# Patient Record
Sex: Male | Born: 1955 | Race: White | Hispanic: No | Marital: Married | Smoking: Never smoker
Health system: Southern US, Community
[De-identification: ages and names within clinical notes are randomized; demographics above are authoritative.]

## PROBLEM LIST (undated history)

## (undated) DIAGNOSIS — E785 Hyperlipidemia, unspecified: Secondary | ICD-10-CM

## (undated) DIAGNOSIS — M1990 Unspecified osteoarthritis, unspecified site: Secondary | ICD-10-CM

## (undated) DIAGNOSIS — I10 Essential (primary) hypertension: Secondary | ICD-10-CM

## (undated) DIAGNOSIS — D0460 Carcinoma in situ of skin of unspecified upper limb, including shoulder: Secondary | ICD-10-CM

## (undated) DIAGNOSIS — R112 Nausea with vomiting, unspecified: Secondary | ICD-10-CM

## (undated) DIAGNOSIS — C61 Malignant neoplasm of prostate: Secondary | ICD-10-CM

## (undated) DIAGNOSIS — K760 Fatty (change of) liver, not elsewhere classified: Secondary | ICD-10-CM

## (undated) DIAGNOSIS — Z9889 Other specified postprocedural states: Secondary | ICD-10-CM

## (undated) DIAGNOSIS — Z9989 Dependence on other enabling machines and devices: Secondary | ICD-10-CM

## (undated) DIAGNOSIS — J189 Pneumonia, unspecified organism: Secondary | ICD-10-CM

## (undated) DIAGNOSIS — C801 Malignant (primary) neoplasm, unspecified: Secondary | ICD-10-CM

## (undated) DIAGNOSIS — Z8719 Personal history of other diseases of the digestive system: Secondary | ICD-10-CM

## (undated) DIAGNOSIS — G4733 Obstructive sleep apnea (adult) (pediatric): Secondary | ICD-10-CM

## (undated) DIAGNOSIS — D649 Anemia, unspecified: Secondary | ICD-10-CM

## (undated) DIAGNOSIS — N529 Male erectile dysfunction, unspecified: Secondary | ICD-10-CM

## (undated) DIAGNOSIS — G4730 Sleep apnea, unspecified: Secondary | ICD-10-CM

## (undated) HISTORY — PX: OTHER SURGICAL HISTORY: SHX169

## (undated) HISTORY — DX: Unspecified osteoarthritis, unspecified site: M19.90

## (undated) HISTORY — DX: Hyperlipidemia, unspecified: E78.5

## (undated) HISTORY — DX: Carcinoma in situ of skin of unspecified upper limb, including shoulder: D04.60

## (undated) HISTORY — PX: COLONOSCOPY: SHX174

## (undated) HISTORY — DX: Male erectile dysfunction, unspecified: N52.9

## (undated) HISTORY — PX: KNEE ARTHROSCOPY: SUR90

## (undated) HISTORY — PX: PROSTATE SURGERY: SHX751

---

## 1998-11-04 ENCOUNTER — Ambulatory Visit (HOSPITAL_COMMUNITY): Admission: RE | Admit: 1998-11-04 | Discharge: 1998-11-04 | Payer: Self-pay | Admitting: Family Medicine

## 1998-11-04 ENCOUNTER — Encounter: Payer: Self-pay | Admitting: Family Medicine

## 1998-11-17 ENCOUNTER — Encounter: Payer: Self-pay | Admitting: Gastroenterology

## 1998-11-17 ENCOUNTER — Ambulatory Visit (HOSPITAL_COMMUNITY): Admission: RE | Admit: 1998-11-17 | Discharge: 1998-11-17 | Payer: Self-pay | Admitting: Gastroenterology

## 2004-01-11 ENCOUNTER — Ambulatory Visit (HOSPITAL_BASED_OUTPATIENT_CLINIC_OR_DEPARTMENT_OTHER): Admission: RE | Admit: 2004-01-11 | Discharge: 2004-01-11 | Payer: Self-pay | Admitting: Family Medicine

## 2004-08-12 ENCOUNTER — Encounter (INDEPENDENT_AMBULATORY_CARE_PROVIDER_SITE_OTHER): Payer: Self-pay | Admitting: Specialist

## 2004-08-12 ENCOUNTER — Ambulatory Visit (HOSPITAL_COMMUNITY): Admission: RE | Admit: 2004-08-12 | Discharge: 2004-08-12 | Payer: Self-pay | Admitting: Gastroenterology

## 2010-10-21 ENCOUNTER — Emergency Department (HOSPITAL_COMMUNITY)
Admission: EM | Admit: 2010-10-21 | Discharge: 2010-10-22 | Payer: Self-pay | Source: Home / Self Care | Admitting: Emergency Medicine

## 2010-11-06 ENCOUNTER — Ambulatory Visit (HOSPITAL_COMMUNITY)
Admission: RE | Admit: 2010-11-06 | Discharge: 2010-11-06 | Payer: Self-pay | Source: Home / Self Care | Attending: Family Medicine | Admitting: Family Medicine

## 2010-11-16 ENCOUNTER — Encounter
Admission: RE | Admit: 2010-11-16 | Discharge: 2010-11-16 | Payer: Self-pay | Source: Home / Self Care | Attending: Family Medicine | Admitting: Family Medicine

## 2011-01-11 LAB — URINALYSIS, ROUTINE W REFLEX MICROSCOPIC
Bilirubin Urine: NEGATIVE
Glucose, UA: NEGATIVE mg/dL
Hgb urine dipstick: NEGATIVE
Ketones, ur: NEGATIVE mg/dL
Nitrite: NEGATIVE
Protein, ur: NEGATIVE mg/dL
Specific Gravity, Urine: 1.019 (ref 1.005–1.030)
Urobilinogen, UA: 0.2 mg/dL (ref 0.0–1.0)
pH: 6.5 (ref 5.0–8.0)

## 2011-01-11 LAB — CBC
HCT: 41.7 % (ref 39.0–52.0)
Hemoglobin: 13.9 g/dL (ref 13.0–17.0)
MCH: 28.4 pg (ref 26.0–34.0)
MCHC: 33.3 g/dL (ref 30.0–36.0)
MCV: 85.3 fL (ref 78.0–100.0)
Platelets: 165 K/uL (ref 150–400)
RBC: 4.89 MIL/uL (ref 4.22–5.81)
RDW: 13.7 % (ref 11.5–15.5)
WBC: 7.5 10*3/uL (ref 4.0–10.5)

## 2011-01-11 LAB — DIFFERENTIAL
Basophils Absolute: 0 K/uL (ref 0.0–0.1)
Basophils Relative: 1 % (ref 0–1)
Eosinophils Absolute: 0.4 10*3/uL (ref 0.0–0.7)
Eosinophils Relative: 5 % (ref 0–5)
Lymphocytes Relative: 41 % (ref 12–46)
Lymphs Abs: 3.1 10*3/uL (ref 0.7–4.0)
Monocytes Absolute: 0.8 K/uL (ref 0.1–1.0)
Monocytes Relative: 10 % (ref 3–12)
Neutro Abs: 3.3 K/uL (ref 1.7–7.7)
Neutrophils Relative %: 44 % (ref 43–77)

## 2011-01-11 LAB — COMPREHENSIVE METABOLIC PANEL WITH GFR
Albumin: 3.9 g/dL (ref 3.5–5.2)
BUN: 13 mg/dL (ref 6–23)
Chloride: 103 meq/L (ref 96–112)
Creatinine, Ser: 1.15 mg/dL (ref 0.4–1.5)
Total Bilirubin: 0.5 mg/dL (ref 0.3–1.2)

## 2011-01-11 LAB — COMPREHENSIVE METABOLIC PANEL
ALT: 22 U/L (ref 0–53)
AST: 28 U/L (ref 0–37)
Alkaline Phosphatase: 58 U/L (ref 39–117)
CO2: 26 mEq/L (ref 19–32)
Calcium: 9 mg/dL (ref 8.4–10.5)
GFR calc Af Amer: >60 mL/min (ref >60)
GFR calc non Af Amer: >60 mL/min (ref >60)
Glucose, Bld: 97 mg/dL (ref 70–99)
Potassium: 3.7 mEq/L (ref 3.5–5.1)
Sodium: 137 mEq/L (ref 135–145)
Total Protein: 6.8 g/dL (ref 6.0–8.3)

## 2011-01-11 LAB — POCT CARDIAC MARKERS
CKMB, poc: <1 ng/mL — ABNORMAL LOW (ref 1.0–8.0)
CKMB, poc: <1 ng/mL — ABNORMAL LOW (ref 1.0–8.0)
Myoglobin, poc: 53.5 ng/mL (ref 12–200)
Myoglobin, poc: 56.9 ng/mL (ref 12–200)
Troponin i, poc: <0.05 ng/mL (ref 0.00–0.09)
Troponin i, poc: <0.05 ng/mL (ref 0.00–0.09)

## 2011-01-11 LAB — LIPASE, BLOOD: Lipase: 28 U/L (ref 11–59)

## 2011-01-25 ENCOUNTER — Other Ambulatory Visit: Payer: Self-pay | Admitting: Dermatology

## 2011-03-19 NOTE — Op Note (Signed)
NAME:  Johnny Logan, Johnny Logan             ACCOUNT NO.:  [id number]   MEDICAL RECORD NO.:  [id number]          PATIENT TYPE:  AMB   LOCATION:  ENDO                         FACILITY:  [facility]   PHYSICIAN:  John C. Madilyn Fireman, M.D.    DATE OF BIRTH:  Feb 08, 1956   DATE OF PROCEDURE:  08/12/2004  DATE OF DISCHARGE:                                 OPERATIVE REPORT   PROCEDURE:  Colonoscopy with polypectomy.   INDICATIONS FOR PROCEDURE:  Family history of colon cancer in a first-degree  relative.   PROCEDURE:  The patient was placed in the left lateral decubitus position  and placed on the pulse monitor, with continuous low-flow oxygen delivered  by nasal cannula.  He was sedated with 87.5 mcg IV fentanyl and 7 mg IV  Versed.  The Olympus video colonoscope was inserted into the rectum and  advanced to the cecum, confirmed by transillumination at McBurney's point  and visualization of the ileocecal valve and appendiceal orifice.  The prep  was excellent.  The cecum, ascending, transverse, descending, and sigmoid  colon all appeared normal, with no masses, polyps, diverticula, or other  mucosal abnormalities.  Within the rectum at 20 cm, there was a 6 mm polyp  that was fulgurated by hot biopsy.  The remainder of the rectum appeared  normal.  The scope was then withdrawn and the patient returned to the  recovery room in stable condition.  He tolerated the procedure well, and  there were no immediate complications.   IMPRESSION:  Rectal polyp.  Otherwise normal study.   PLAN:  Await histology to determine method and interval for future colon  screening.      JCH/MEDQ  D:  08/12/2004  T:  08/12/2004  Job:  [id number]   cc:   Stacie Acres. White, M.D.  [address]  [place]  [place] [address]  Fax: [phone]

## 2012-06-24 IMAGING — CR DG ABDOMEN ACUTE W/ 1V CHEST
4 series · 4 of 4 positions shown · non-contrast
Comparison: Abdominal ultrasound performed 10/21/2010

CLINICAL DATA: Chest pain and tightness; nausea.

ACUTE ABDOMEN SERIES (ABDOMEN 2 VIEW & CHEST 1 VIEW)

[w chest pa]
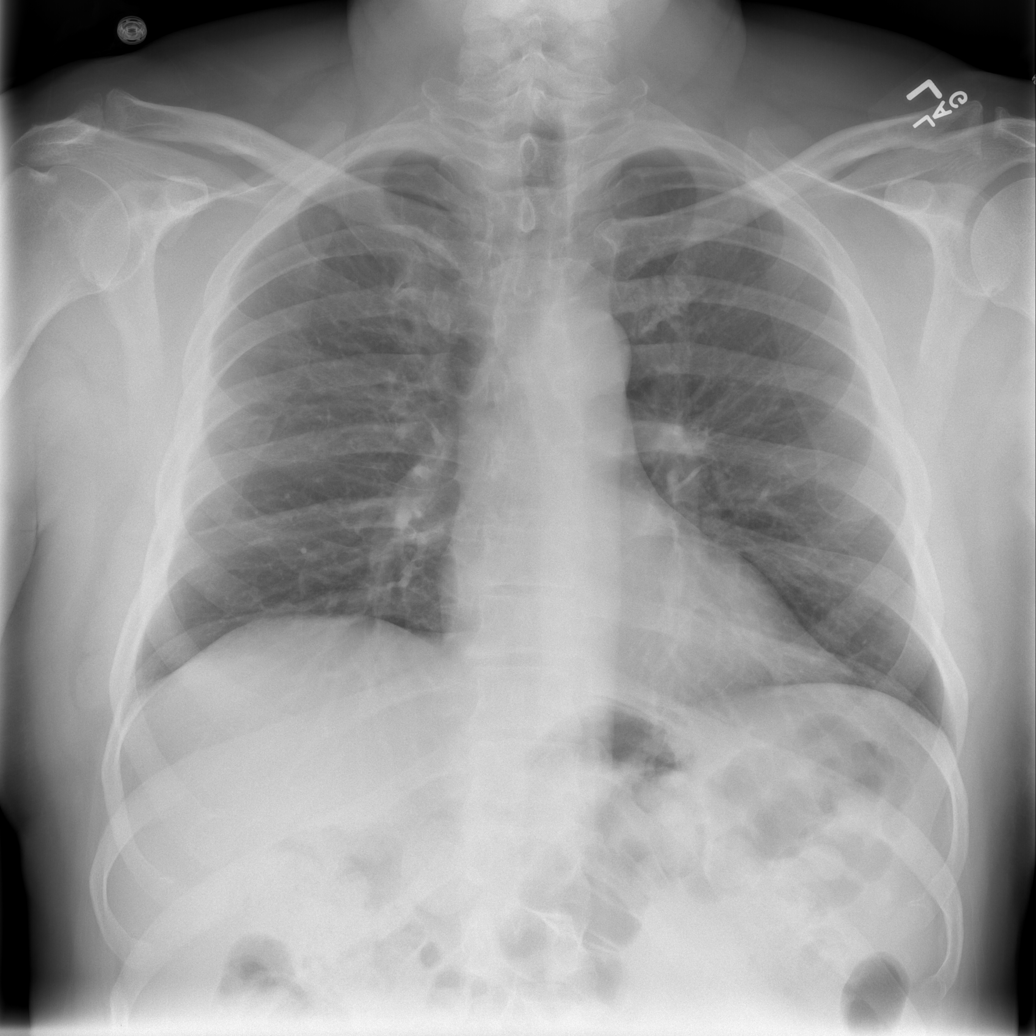

[w abdomen upright *]
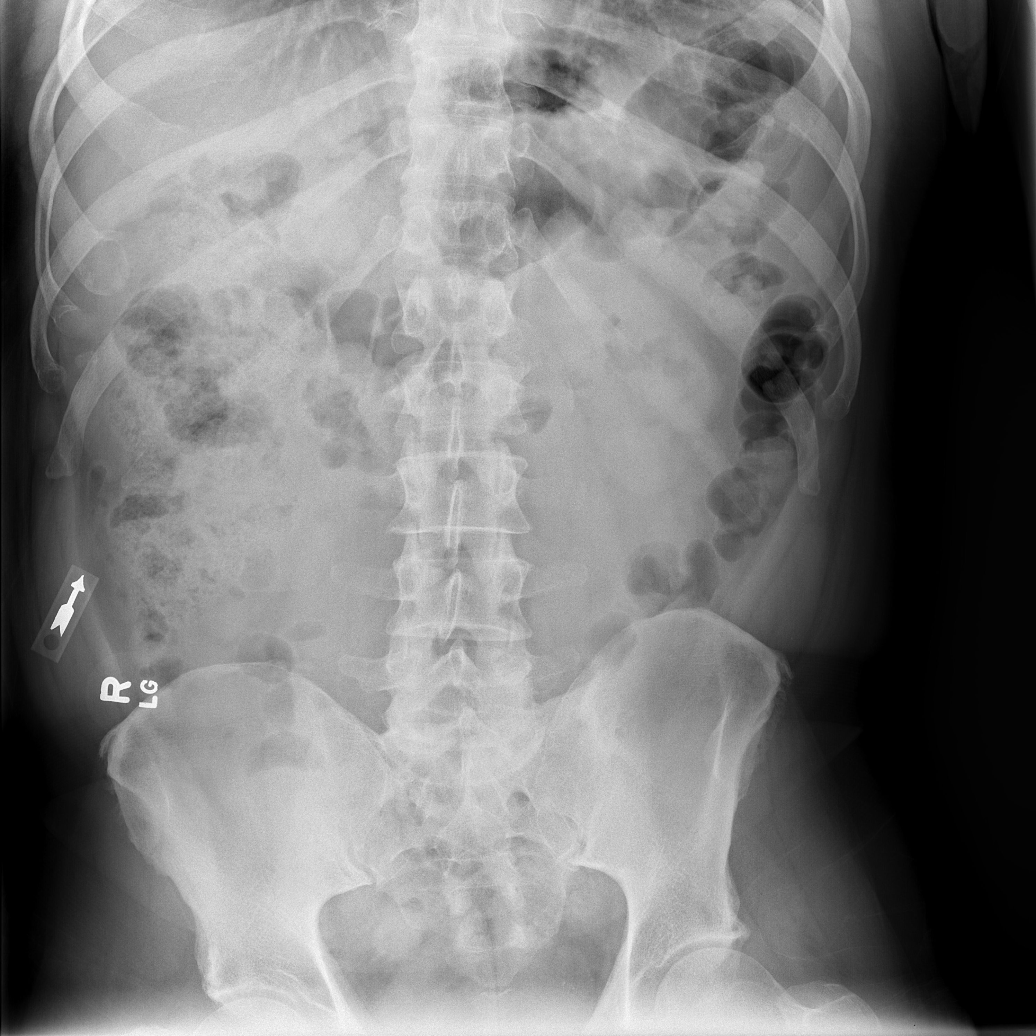

[t abdomen supine (1 of 2)]
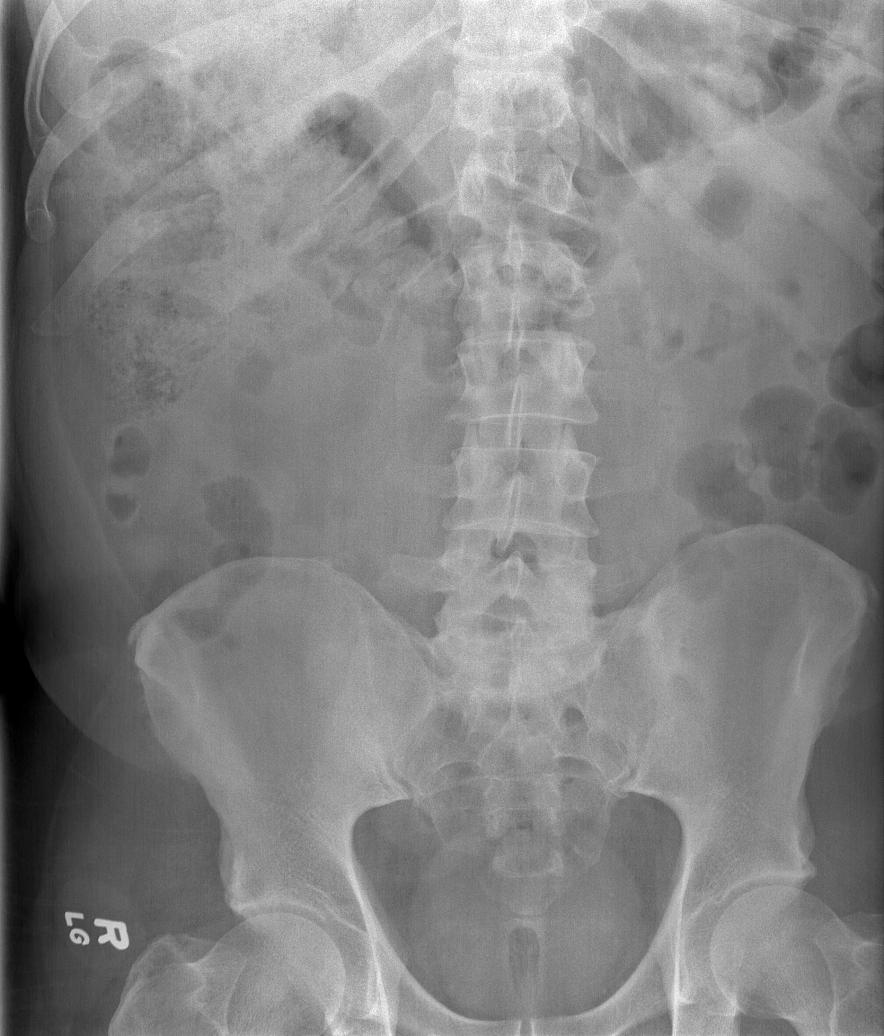

[t abdomen supine (2 of 2)]
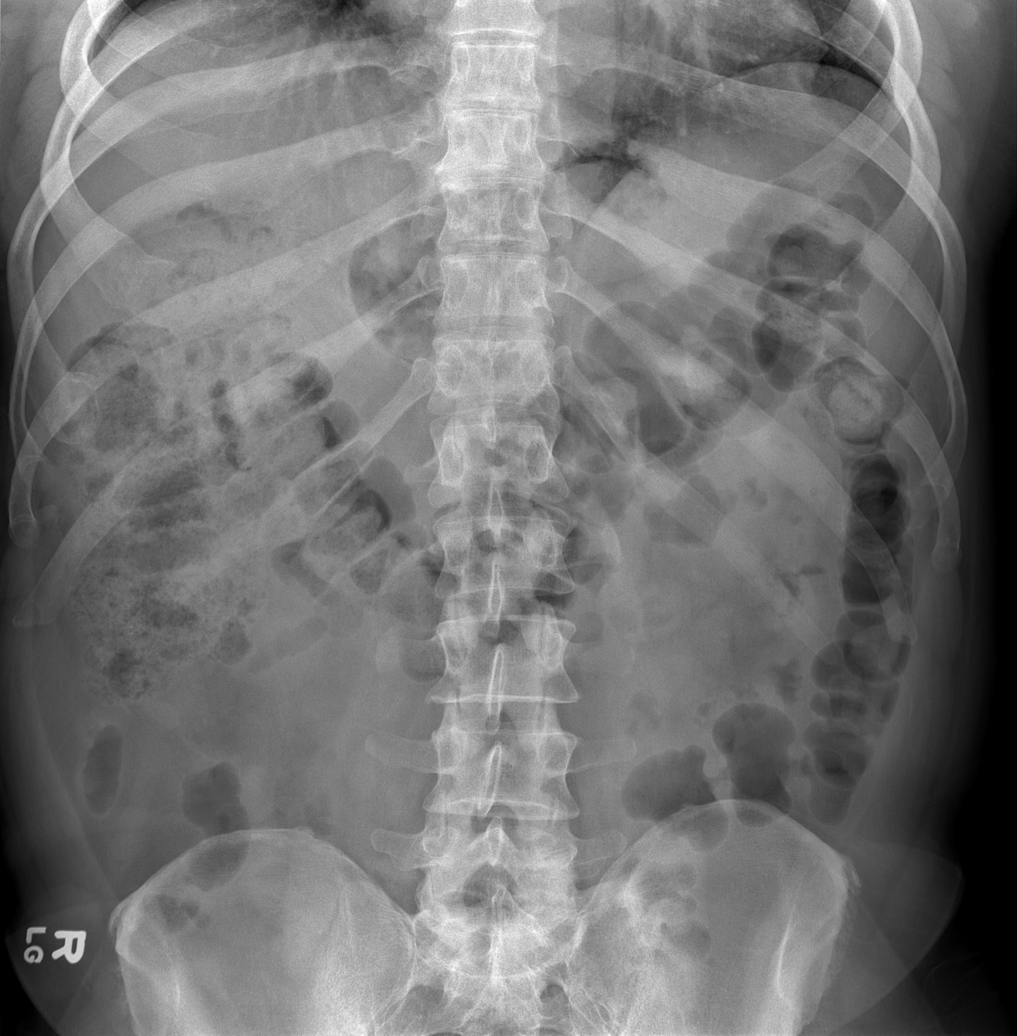

[4 of 4 positions shown; findings below may reference images not displayed]

FINDINGS: The lungs are well-aerated and clear.  There is no
evidence of focal opacification, pleural effusion or pneumothorax.
The cardiomediastinal silhouette is within normal limits.

The visualized bowel gas pattern is unremarkable. Stool and air are
noted throughout the colon; there is no evidence of small bowel
dilatation to suggest obstruction.  A large amount of stool is
noted within the ascending colon.  No free intra-abdominal air is
identified on the provided upright view.

No acute osseous abnormalities are seen; the sacroiliac joints are
unremarkable in appearance.
IMPRESSION: 1.  Unremarkable bowel gas pattern; no free intra-abdominal air
seen.  Large amount of stool noted in the ascending colon.
2.  No acute cardiopulmonary process identified.

## 2012-12-28 ENCOUNTER — Other Ambulatory Visit: Payer: Self-pay | Admitting: Family Medicine

## 2012-12-28 DIAGNOSIS — R55 Syncope and collapse: Secondary | ICD-10-CM

## 2012-12-29 ENCOUNTER — Ambulatory Visit
Admission: RE | Admit: 2012-12-29 | Discharge: 2012-12-29 | Disposition: A | Discharge status: Home / Self Care | Payer: BC Managed Care – PPO | Source: Ambulatory Visit | Attending: Family Medicine | Admitting: Family Medicine

## 2012-12-29 DIAGNOSIS — R55 Syncope and collapse: Secondary | ICD-10-CM

## 2013-05-03 ENCOUNTER — Other Ambulatory Visit: Payer: Self-pay | Admitting: Dermatology

## 2014-06-21 NOTE — Progress Notes (Signed)
Arlee Muslim, PA  - Please enter preop orders in epic for Johnny Logan - he is coming to [facility] on 8/31 for preop / labs.  Thanks.

## 2014-06-24 ENCOUNTER — Encounter (HOSPITAL_COMMUNITY): Payer: Self-pay | Admitting: Pharmacist

## 2014-06-27 NOTE — Patient Instructions (Addendum)
ROLLAND STEINERT  06/27/2014   Your procedure is scheduled on:  07/03/2014    Report to [facility]  Follow the Signs to [facility] at  1045am  Call this number if you have problems the morning of surgery: [phone]   Remember:   Do not eat food or drink liquids after midnight.   Take these medicines the morning of surgery with A SIP OF WATER:    Do not wear jewelry  Do not wear lotions, powders, or perfumes, deodorant     Men may shave face and neck.  Do not bring valuables to the hospital.  Contacts, dentures or bridgework may not be worn into surgery.       Patients discharged the day of surgery will not be allowed to drive  home.  Name and phone number of your driver:       Please read over the following fact sheets that you were given: [facility] - Preparing for Surgery Before surgery, you can play an important role.  Because skin is not sterile, your skin needs to be as free of germs as possible.  You can reduce the number of germs on your skin by washing with CHG (chlorahexidine gluconate) soap before surgery.  CHG is an antiseptic cleaner which kills germs and bonds with the skin to continue killing germs even after washing. Please DO NOT use if you have an allergy to CHG or antibacterial soaps.  If your skin becomes reddened/irritated stop using the CHG and inform your nurse when you arrive at Short Stay. Do not shave (including legs and underarms) for at least 48 hours prior to the first CHG shower.  You may shave your face/neck. Please follow these instructions carefully:  1.  Shower with CHG Soap the night before surgery and the  morning of Surgery.  2.  If you choose to wash your hair, wash your hair first as usual with your  normal  shampoo.  3.  After you shampoo, rinse your hair and body thoroughly to remove the  shampoo.                           4.  Use CHG as you would any other liquid soap.  You can apply chg directly  to the skin and wash                    Gently with a scrungie or clean washcloth.  5.  Apply the CHG Soap to your body ONLY FROM THE NECK DOWN.   Do not use on face/ open                           Wound or open sores. Avoid contact with eyes, ears mouth and genitals (private parts).                       Wash face,  Genitals (private parts) with your normal soap.             6.  Wash thoroughly, paying special attention to the area where your surgery  will be performed.  7.  Thoroughly rinse your body with warm water from the neck down.  8.  DO NOT shower/wash with your normal soap after using and rinsing off  the CHG Soap.  9.  Pat yourself dry with a clean towel.            10.  Wear clean pajamas.            11.  Place clean sheets on your bed the night of your first shower and do not  sleep with pets. Day of Surgery : Do not apply any lotions/deodorants the morning of surgery.  Please wear clean clothes to the hospital/surgery center.  FAILURE TO FOLLOW THESE INSTRUCTIONS MAY RESULT IN THE CANCELLATION OF YOUR SURGERY PATIENT SIGNATURE_________________________________  NURSE SIGNATURE__________________________________  ________________________________________________________________________ , coughing and deep breathing exercises, leg exercises            -

## 2014-06-27 NOTE — Progress Notes (Signed)
Surgery on 07/03/2014.  Preop on 07/01/14.  Need orders in EPIC.  Thank You.

## 2014-06-28 ENCOUNTER — Other Ambulatory Visit: Payer: Self-pay | Admitting: Orthopedic Surgery

## 2014-07-01 ENCOUNTER — Encounter (HOSPITAL_COMMUNITY): Payer: Self-pay

## 2014-07-01 ENCOUNTER — Encounter (INDEPENDENT_AMBULATORY_CARE_PROVIDER_SITE_OTHER): Payer: Self-pay

## 2014-07-01 ENCOUNTER — Ambulatory Visit (HOSPITAL_COMMUNITY)
Admission: RE | Admit: 2014-07-01 | Discharge: 2014-07-01 | Disposition: A | Discharge status: Home / Self Care | Payer: BC Managed Care – PPO | Source: Ambulatory Visit | Attending: Orthopedic Surgery | Admitting: Orthopedic Surgery

## 2014-07-01 ENCOUNTER — Encounter (HOSPITAL_COMMUNITY)
Admission: RE | Admit: 2014-07-01 | Discharge: 2014-07-01 | Disposition: A | Discharge status: Home / Self Care | Payer: BC Managed Care – PPO | Source: Ambulatory Visit | Attending: Orthopedic Surgery | Admitting: Orthopedic Surgery

## 2014-07-01 DIAGNOSIS — Z01811 Encounter for preprocedural respiratory examination: Secondary | ICD-10-CM | POA: Insufficient documentation

## 2014-07-01 HISTORY — DX: Personal history of other diseases of the digestive system: Z87.19

## 2014-07-01 HISTORY — DX: Essential (primary) hypertension: I10

## 2014-07-01 HISTORY — DX: Anemia, unspecified: D64.9

## 2014-07-01 HISTORY — DX: Nausea with vomiting, unspecified: R11.2

## 2014-07-01 HISTORY — DX: Unspecified osteoarthritis, unspecified site: M19.90

## 2014-07-01 HISTORY — DX: Other specified postprocedural states: Z98.890

## 2014-07-01 HISTORY — DX: Sleep apnea, unspecified: G47.30

## 2014-07-01 HISTORY — DX: Malignant (primary) neoplasm, unspecified: C80.1

## 2014-07-01 LAB — CBC
HEMATOCRIT: 41.4 % (ref 39.0–52.0)
HEMOGLOBIN: 13.8 g/dL (ref 13.0–17.0)
MCH: 27.7 pg (ref 26.0–34.0)
MCHC: 33.3 g/dL (ref 30.0–36.0)
MCV: 83 fL (ref 78.0–100.0)
Platelets: 205 10*3/uL (ref 150–400)
RBC: 4.99 MIL/uL (ref 4.22–5.81)
RDW: 14.2 % (ref 11.5–15.5)
WBC: 5.5 10*3/uL (ref 4.0–10.5)

## 2014-07-01 LAB — BASIC METABOLIC PANEL
Anion gap: 13 (ref 5–15)
BUN: 14 mg/dL (ref 6–23)
CALCIUM: 9.3 mg/dL (ref 8.4–10.5)
CO2: 25 meq/L (ref 19–32)
CREATININE: 0.99 mg/dL (ref 0.50–1.35)
Chloride: 102 mEq/L (ref 96–112)
GFR calc Af Amer: >90 mL/min (ref >90)
GFR calc non Af Amer: 87 mL/min — ABNORMAL LOW (ref >90)
GLUCOSE: 107 mg/dL — AB (ref 70–99)
Potassium: 4.4 mEq/L (ref 3.7–5.3)
Sodium: 140 mEq/L (ref 137–147)

## 2014-07-03 ENCOUNTER — Ambulatory Visit (HOSPITAL_COMMUNITY)
Admission: RE | Admit: 2014-07-03 | Discharge: 2014-07-03 | Disposition: A | Discharge status: Home / Self Care | Payer: BC Managed Care – PPO | Source: Ambulatory Visit | Attending: Orthopedic Surgery | Admitting: Orthopedic Surgery

## 2014-07-03 ENCOUNTER — Encounter (HOSPITAL_COMMUNITY): Payer: Self-pay | Admitting: *Deleted

## 2014-07-03 ENCOUNTER — Ambulatory Visit (HOSPITAL_COMMUNITY): Payer: BC Managed Care – PPO | Admitting: Anesthesiology

## 2014-07-03 ENCOUNTER — Encounter (HOSPITAL_COMMUNITY)
Admission: RE | Disposition: A | Discharge status: Home / Self Care | Payer: Self-pay | Source: Ambulatory Visit | Attending: Orthopedic Surgery

## 2014-07-03 ENCOUNTER — Encounter (HOSPITAL_COMMUNITY): Payer: BC Managed Care – PPO | Admitting: Anesthesiology

## 2014-07-03 DIAGNOSIS — Z8546 Personal history of malignant neoplasm of prostate: Secondary | ICD-10-CM | POA: Diagnosis not present

## 2014-07-03 DIAGNOSIS — M129 Arthropathy, unspecified: Secondary | ICD-10-CM | POA: Insufficient documentation

## 2014-07-03 DIAGNOSIS — R52 Pain, unspecified: Secondary | ICD-10-CM | POA: Diagnosis present

## 2014-07-03 DIAGNOSIS — M23329 Other meniscus derangements, posterior horn of medial meniscus, unspecified knee: Secondary | ICD-10-CM | POA: Diagnosis not present

## 2014-07-03 DIAGNOSIS — S83249A Other tear of medial meniscus, current injury, unspecified knee, initial encounter: Secondary | ICD-10-CM | POA: Diagnosis present

## 2014-07-03 DIAGNOSIS — I10 Essential (primary) hypertension: Secondary | ICD-10-CM | POA: Diagnosis not present

## 2014-07-03 DIAGNOSIS — D649 Anemia, unspecified: Secondary | ICD-10-CM | POA: Insufficient documentation

## 2014-07-03 DIAGNOSIS — G4730 Sleep apnea, unspecified: Secondary | ICD-10-CM | POA: Insufficient documentation

## 2014-07-03 DIAGNOSIS — K449 Diaphragmatic hernia without obstruction or gangrene: Secondary | ICD-10-CM | POA: Diagnosis not present

## 2014-07-03 DIAGNOSIS — Z79899 Other long term (current) drug therapy: Secondary | ICD-10-CM | POA: Insufficient documentation

## 2014-07-03 DIAGNOSIS — Z7982 Long term (current) use of aspirin: Secondary | ICD-10-CM | POA: Diagnosis not present

## 2014-07-03 DIAGNOSIS — M25569 Pain in unspecified knee: Secondary | ICD-10-CM | POA: Diagnosis present

## 2014-07-03 DIAGNOSIS — S83241D Other tear of medial meniscus, current injury, right knee, subsequent encounter: Secondary | ICD-10-CM

## 2014-07-03 HISTORY — PX: KNEE ARTHROSCOPY WITH MENISCAL REPAIR: SHX5653

## 2014-07-03 SURGERY — ARTHROSCOPY, KNEE, WITH MENISCUS REPAIR
Anesthesia: General | Site: Knee | Laterality: Right

## 2014-07-03 MED ORDER — BUPIVACAINE-EPINEPHRINE (PF) 0.25% -1:200000 IJ SOLN
INTRAMUSCULAR | Status: AC
  Filled 2014-07-03: qty 30

## 2014-07-03 MED ORDER — PROMETHAZINE HCL 25 MG/ML IJ SOLN: 6.2500 mg | INTRAMUSCULAR | Status: DC | PRN

## 2014-07-03 MED ORDER — CEFAZOLIN SODIUM-DEXTROSE 2-3 GM-% IV SOLR
2.0000 g | INTRAVENOUS | Status: AC
  Administered 2014-07-03: 2 g via INTRAVENOUS

## 2014-07-03 MED ORDER — ACETAMINOPHEN 10 MG/ML IV SOLN
1000.0000 mg | Freq: Once | INTRAVENOUS | Status: AC
  Administered 2014-07-03: 1000 mg via INTRAVENOUS
  Filled 2014-07-03: qty 100

## 2014-07-03 MED ORDER — FENTANYL CITRATE 0.05 MG/ML IJ SOLN
INTRAMUSCULAR | Status: AC
  Filled 2014-07-03: qty 5

## 2014-07-03 MED ORDER — PROPOFOL 10 MG/ML IV BOLUS
INTRAVENOUS | Status: DC | PRN
  Administered 2014-07-03: 200 mg via INTRAVENOUS
  Administered 2014-07-03: 50 mg via INTRAVENOUS

## 2014-07-03 MED ORDER — SODIUM CHLORIDE 0.9 % IV SOLN: INTRAVENOUS | Status: DC

## 2014-07-03 MED ORDER — PROPOFOL 10 MG/ML IV BOLUS
INTRAVENOUS | Status: AC
  Filled 2014-07-03: qty 20

## 2014-07-03 MED ORDER — OXYCODONE HCL 5 MG/5ML PO SOLN: 5.0000 mg | Freq: Once | ORAL | Status: AC | PRN

## 2014-07-03 MED ORDER — ONDANSETRON HCL 4 MG/2ML IJ SOLN
INTRAMUSCULAR | Status: AC
  Filled 2014-07-03: qty 2

## 2014-07-03 MED ORDER — DEXAMETHASONE SODIUM PHOSPHATE 10 MG/ML IJ SOLN: 10.0000 mg | Freq: Once | INTRAMUSCULAR | Status: DC

## 2014-07-03 MED ORDER — HYDROMORPHONE HCL PF 1 MG/ML IJ SOLN: 0.2500 mg | INTRAMUSCULAR | Status: DC | PRN

## 2014-07-03 MED ORDER — FENTANYL CITRATE 0.05 MG/ML IJ SOLN
INTRAMUSCULAR | Status: DC | PRN
  Administered 2014-07-03 (×2): 50 ug via INTRAVENOUS
  Administered 2014-07-03 (×2): 25 ug via INTRAVENOUS

## 2014-07-03 MED ORDER — BUPIVACAINE-EPINEPHRINE 0.25% -1:200000 IJ SOLN
INTRAMUSCULAR | Status: DC | PRN
  Administered 2014-07-03: 20 mL

## 2014-07-03 MED ORDER — CEFAZOLIN SODIUM-DEXTROSE 2-3 GM-% IV SOLR
INTRAVENOUS | Status: AC
  Filled 2014-07-03: qty 50

## 2014-07-03 MED ORDER — METHOCARBAMOL 500 MG PO TABS: 500.0000 mg | ORAL_TABLET | Freq: Four times a day (QID) | ORAL | Status: DC

## 2014-07-03 MED ORDER — DEXAMETHASONE SODIUM PHOSPHATE 10 MG/ML IJ SOLN
INTRAMUSCULAR | Status: DC | PRN
  Administered 2014-07-03: 10 mg via INTRAVENOUS

## 2014-07-03 MED ORDER — OXYCODONE HCL 5 MG PO TABS
5.0000 mg | ORAL_TABLET | Freq: Once | ORAL | Status: AC | PRN
  Administered 2014-07-03: 5 mg via ORAL
  Filled 2014-07-03: qty 1

## 2014-07-03 MED ORDER — ONDANSETRON HCL 4 MG/2ML IJ SOLN
INTRAMUSCULAR | Status: DC | PRN
  Administered 2014-07-03: 4 mg via INTRAVENOUS

## 2014-07-03 MED ORDER — HYDROCODONE-ACETAMINOPHEN 5-325 MG PO TABS: 1.0000 | ORAL_TABLET | ORAL | Status: DC | PRN

## 2014-07-03 MED ORDER — CHLORHEXIDINE GLUCONATE 4 % EX LIQD: 60.0000 mL | Freq: Once | CUTANEOUS | Status: DC

## 2014-07-03 MED ORDER — LACTATED RINGERS IR SOLN
Status: DC | PRN
  Administered 2014-07-03: 6000 mL

## 2014-07-03 MED ORDER — DEXAMETHASONE SODIUM PHOSPHATE 10 MG/ML IJ SOLN
INTRAMUSCULAR | Status: AC
  Filled 2014-07-03: qty 1

## 2014-07-03 MED ORDER — MEPERIDINE HCL 50 MG/ML IJ SOLN: 6.2500 mg | INTRAMUSCULAR | Status: DC | PRN

## 2014-07-03 MED ORDER — LACTATED RINGERS IV SOLN
INTRAVENOUS | Status: DC
  Administered 2014-07-03: 1000 mL via INTRAVENOUS

## 2014-07-03 MED ORDER — MIDAZOLAM HCL 2 MG/2ML IJ SOLN
INTRAMUSCULAR | Status: AC
  Filled 2014-07-03: qty 2

## 2014-07-03 MED ORDER — MIDAZOLAM HCL 5 MG/5ML IJ SOLN
INTRAMUSCULAR | Status: DC | PRN
  Administered 2014-07-03: 2 mg via INTRAVENOUS

## 2014-07-03 SURGICAL SUPPLY — 25 items
BLADE 4.2CUDA (BLADE) ×2 IMPLANT
CLOTH BEACON ORANGE TIMEOUT ST (SAFETY) ×2 IMPLANT
COUNTER NEEDLE 20 DBL MAG RED (NEEDLE) ×2 IMPLANT
CUFF TOURN SGL QUICK 34 (TOURNIQUET CUFF) ×1
CUFF TRNQT CYL 34X4X40X1 (TOURNIQUET CUFF) ×1 IMPLANT
DRAPE U-SHAPE 47X51 STRL (DRAPES) ×2 IMPLANT
DRSG EMULSION OIL 3X3 NADH (GAUZE/BANDAGES/DRESSINGS) ×2 IMPLANT
DURAPREP 26ML APPLICATOR (WOUND CARE) ×2 IMPLANT
GLOVE BIO SURGEON STRL SZ8 (GLOVE) ×2 IMPLANT
GLOVE BIOGEL PI IND STRL 8 (GLOVE) ×2 IMPLANT
GLOVE BIOGEL PI INDICATOR 8 (GLOVE) ×2
GLOVE ECLIPSE 7.5 STRL STRAW (GLOVE) ×2 IMPLANT
GOWN STRL REUS W/TWL LRG LVL3 (GOWN DISPOSABLE) ×2 IMPLANT
GOWN STRL REUS W/TWL XL LVL3 (GOWN DISPOSABLE) ×2 IMPLANT
MANIFOLD NEPTUNE II (INSTRUMENTS) ×2 IMPLANT
PACK ARTHROSCOPY WL (CUSTOM PROCEDURE TRAY) ×2 IMPLANT
PACK ICE MAXI GEL EZY WRAP (MISCELLANEOUS) ×6 IMPLANT
PADDING CAST COTTON 6X4 STRL (CAST SUPPLIES) ×4 IMPLANT
POSITIONER SURGICAL ARM (MISCELLANEOUS) ×2 IMPLANT
SET ARTHROSCOPY TUBING (MISCELLANEOUS) ×1
SET ARTHROSCOPY TUBING LN (MISCELLANEOUS) ×1 IMPLANT
SUT ETHILON 4 0 PS 2 18 (SUTURE) ×2 IMPLANT
TOWEL OR 17X26 10 PK STRL BLUE (TOWEL DISPOSABLE) ×2 IMPLANT
WAND 90 DEG TURBOVAC W/CORD (SURGICAL WAND) ×2 IMPLANT
WRAP KNEE MAXI GEL POST OP (GAUZE/BANDAGES/DRESSINGS) ×2 IMPLANT

## 2014-07-03 NOTE — Anesthesia Postprocedure Evaluation (Signed)
Anesthesia Post Note  Patient: Johnny Logan  Procedure(s) Performed: Procedure(s) (LRB): RIGHT KNEE ARTHROSCOPY WITH MEDIAL MENISCAL  DEBRIDEMENT, CHONDROPLASTY (Right)  Anesthesia type: General  Patient location: PACU  Post pain: Pain level controlled  Post assessment: Post-op Vital signs reviewed  Last Vitals: BP 137/77  Pulse 53  Temp(Src) 36.3 C (Oral)  Resp 18  SpO2 95%  Post vital signs: Reviewed  Level of consciousness: sedated  Complications: No apparent anesthesia complications

## 2014-07-03 NOTE — Transfer of Care (Signed)
Immediate Anesthesia Transfer of Care Note  Patient: Johnny Logan  Procedure(s) Performed: Procedure(s): RIGHT KNEE ARTHROSCOPY WITH MEDIAL MENISCAL  DEBRIDEMENT, CHONDROPLASTY (Right)  Patient Location: PACU  Anesthesia Type:General  Level of Consciousness: awake, sedated and patient cooperative  Airway & Oxygen Therapy: Patient Spontanous Breathing and Patient connected to face mask oxygen  Post-op Assessment: Report given to PACU RN and Post -op Vital signs reviewed and stable  Post vital signs: Reviewed and stable  Complications: No apparent anesthesia complications

## 2014-07-03 NOTE — Anesthesia Preprocedure Evaluation (Signed)
Anesthesia Evaluation  Patient identified by MRN, date of birth, ID band Patient awake    Reviewed: Allergy & Precautions, H&P , NPO status , Patient's Chart, lab work & pertinent test results  Airway Mallampati: II TM Distance: >3 FB Neck ROM: Full    Dental no notable dental hx.    Pulmonary sleep apnea ,  breath sounds clear to auscultation  Pulmonary exam normal       Cardiovascular hypertension, Pt. on medications Rhythm:Regular Rate:Normal     Neuro/Psych negative neurological ROS  negative psych ROS   GI/Hepatic Neg liver ROS, hiatal hernia,   Endo/Other  negative endocrine ROS  Renal/GU negative Renal ROS     Musculoskeletal  (+) Arthritis -,   Abdominal   Peds  Hematology negative hematology ROS (+) anemia ,   Anesthesia Other Findings   Reproductive/Obstetrics negative OB ROS                           Anesthesia Physical Anesthesia Plan  ASA: II  Anesthesia Plan: General   Post-op Pain Management:    Induction: Intravenous  Airway Management Planned: LMA  Additional Equipment:   Intra-op Plan:   Post-operative Plan: Extubation in OR  Informed Consent: I have reviewed the patients History and Physical, chart, labs and discussed the procedure including the risks, benefits and alternatives for the proposed anesthesia with the patient or authorized representative who has indicated his/her understanding and acceptance.   Dental advisory given  Plan Discussed with: CRNA  Anesthesia Plan Comments:         Anesthesia Quick Evaluation

## 2014-07-03 NOTE — Interval H&P Note (Signed)
History and Physical Interval Note:  07/03/2014 12:16 PM  Johnny Logan  has presented today for surgery, with the diagnosis of right knee medial meniscal tear  The various methods of treatment have been discussed with the patient and family. After consideration of risks, benefits and other options for treatment, the patient has consented to  Procedure(s): RIGHT KNEE ARTHROSCOPY WITH DEBRIDEMENT (Right) as a surgical intervention .  The patient's history has been reviewed, patient examined, no change in status, stable for surgery.  I have reviewed the patient's chart and labs.  Questions were answered to the patient's satisfaction.     Gearlean Alf

## 2014-07-03 NOTE — H&P (Signed)
  CC- Johnny Logan is a 58 y.o. male who presents with right knee pain.  HPI- . Knee Pain: Patient presents with knee pain involving the  right knee. Onset of the symptoms was several months ago. Inciting event: hyperextended knee recently. Current symptoms include giving out, pain located medially and stiffness. Pain is aggravated by lateral movements, rising after sitting, squatting and walking.  Patient has had prior knee problems. Evaluation to date: MRI: abnormal medial meniscal tear. Treatment to date: rest.  Past Medical History  Diagnosis Date  . PONV (postoperative nausea and vomiting)   . Hypertension   . Sleep apnea     does not use cpap   . H/O hiatal hernia   . Arthritis   . Cancer     hx of prostate cancer   . Anemia     Past Surgical History  Procedure Laterality Date  . Knee arthroscopy      bilateral   . Right ankle reconstructive surgery     . Prostate surgery      Prior to Admission medications   Medication Sig Start Date End Date Taking? Authorizing Provider  aspirin EC 81 MG tablet Take 81 mg by mouth daily.    Historical Provider, MD  ciprofloxacin (CIPRO) 500 MG tablet Take 500 mg by mouth 2 (two) times daily. Finishes on 06/29/14.    Historical Provider, MD  pravastatin (PRAVACHOL) 40 MG tablet Take 40 mg by mouth daily at 6 PM.    Historical Provider, MD  telmisartan (MICARDIS) 20 MG tablet Take 20 mg by mouth daily.    Historical Provider, MD   KNEE EXAM antalgic gait, soft tissue tenderness over medial joint line, no effusion, negative drawer sign, collateral ligaments intact  Physical Examination: General appearance - alert, well appearing, and in no distress Mental status - alert, oriented to person, place, and time Chest - clear to auscultation, no wheezes, rales or rhonchi, symmetric air entry Heart - normal rate, regular rhythm, normal S1, S2, no murmurs, rubs, clicks or gallops Abdomen - soft, nontender, nondistended, no masses or  organomegaly Neurological - alert, oriented, normal speech, no focal findings or movement disorder noted    Asessment/Plan--- Right knee medial meniscal tear- - Plan right knee arthroscopy with meniscal debridement. Procedure risks and potential comps discussed with patient who elects to proceed. Goals are decreased pain and increased function with a high likelihood of achieving both

## 2014-07-03 NOTE — Discharge Instructions (Signed)

## 2014-07-03 NOTE — Op Note (Signed)
Preoperative diagnosis-  Right knee medial meniscal tear  Postoperative diagnosis Right- knee medial meniscal tear plus  Medial femoral chondral defect  Procedure- Right knee arthroscopy with medial meniscal debridement and chondroplasty   Surgeon- Dione Plover. Serra Younan, MD  Anesthesia-General  EBL-  Minimal  Complications- None  Condition- PACU - hemodynamically stable.  Brief clinical note- -Johnny Logan is a 58 y.o.  male with a several month history of right knee pain and mechanical symptoms. Exam and history suggested medial meniscal tear confirmed by MRI. The patient presents now for arthroscopy and debridement  Procedure in detail -       After successful administration of General anesthetic, a tourmiquet is placed high on the Right  thigh and the Right lower extremity is prepped and draped in the usual sterile fashion. Time out is performed by the surgical team. Standard superomedial and inferolateral portal sites are marked and incisions made with an 11 blade. The inflow cannula is passed through the superomedial portal and camera through the inferolateral portal and inflow is initiated. Arthroscopic visualization proceeds.      The undersurface of the patella and trochlea are visualized and there is mild chondromalacia both surfaces but no unstable cartilage lesion. The medial and lateral gutters are visualized and there are   no loose bodies. Flexion and valgus force is applied to the knee and the medial compartment is entered. A spinal needle is passed into the joint through the site marked for the inferomedial portal. A small incision is made and the dilator passed into the joint. The findings for the medial compartment are unstable degenerative tear of body and posterior horn of the medial meniscus with a 1 x 1 cm area of unstable cartilage on the surface of the medial femoral condyle . The tear is debrided to a stable base with baskets and a shaver and sealed off with the  Arthrocare. The shaver is used to debride the unstable cartilage to a stable cartilaginous base with stable edges. It is probed and found to be stable.    The intercondylar notch is visualized and the ACL appears normal. The lateral compartment is entered and the findings are normal. The tear is debrided to a stable base with baskets and a shaver and sealed off with the Arthrocare. It is probed and found to be stable.     The joint is again inspected and there are no other tears, defects or loose bodies identified. The arthroscopic equipment is then removed from the inferior portals which are closed with interrupted 4-0 nylon. 20 ml of .25% Marcaine with epinephrine are injected through the inflow cannula and the cannula is then removed and the portal closed with nylon. The incisions are cleaned and dried and a bulky sterile dressing is applied. The patient is then awakened and transported to recovery in stable condition.   07/03/2014, 1:12 PM

## 2014-07-04 ENCOUNTER — Encounter (HOSPITAL_COMMUNITY): Payer: Self-pay | Admitting: Orthopedic Surgery

## 2016-01-01 ENCOUNTER — Ambulatory Visit
Admission: RE | Admit: 2016-01-01 | Discharge: 2016-01-01 | Disposition: A | Discharge status: Home / Self Care | Payer: Self-pay | Source: Ambulatory Visit | Attending: Family Medicine | Admitting: Family Medicine

## 2016-01-01 ENCOUNTER — Other Ambulatory Visit: Payer: Self-pay | Admitting: Family Medicine

## 2016-01-01 DIAGNOSIS — J200 Acute bronchitis due to Mycoplasma pneumoniae: Secondary | ICD-10-CM

## 2016-04-13 DIAGNOSIS — M25511 Pain in right shoulder: Secondary | ICD-10-CM | POA: Diagnosis not present

## 2016-04-26 ENCOUNTER — Other Ambulatory Visit: Payer: Self-pay | Admitting: Family Medicine

## 2016-04-26 DIAGNOSIS — M25511 Pain in right shoulder: Principal | ICD-10-CM

## 2016-04-26 DIAGNOSIS — G8929 Other chronic pain: Secondary | ICD-10-CM

## 2016-04-28 ENCOUNTER — Ambulatory Visit
Admission: RE | Admit: 2016-04-28 | Discharge: 2016-04-28 | Disposition: A | Discharge status: Home / Self Care | Payer: BLUE CROSS/BLUE SHIELD | Source: Ambulatory Visit | Attending: Family Medicine | Admitting: Family Medicine

## 2016-04-28 DIAGNOSIS — S43431A Superior glenoid labrum lesion of right shoulder, initial encounter: Secondary | ICD-10-CM | POA: Diagnosis not present

## 2016-04-28 DIAGNOSIS — M25511 Pain in right shoulder: Principal | ICD-10-CM

## 2016-04-28 DIAGNOSIS — G8929 Other chronic pain: Secondary | ICD-10-CM

## 2016-05-02 ENCOUNTER — Other Ambulatory Visit: Payer: BLUE CROSS/BLUE SHIELD

## 2016-05-03 DIAGNOSIS — S161XXA Strain of muscle, fascia and tendon at neck level, initial encounter: Secondary | ICD-10-CM | POA: Diagnosis not present

## 2016-05-03 DIAGNOSIS — M7541 Impingement syndrome of right shoulder: Secondary | ICD-10-CM | POA: Diagnosis not present

## 2016-05-12 DIAGNOSIS — S161XXD Strain of muscle, fascia and tendon at neck level, subsequent encounter: Secondary | ICD-10-CM | POA: Diagnosis not present

## 2016-05-18 DIAGNOSIS — S161XXD Strain of muscle, fascia and tendon at neck level, subsequent encounter: Secondary | ICD-10-CM | POA: Diagnosis not present

## 2016-05-25 DIAGNOSIS — E7800 Pure hypercholesterolemia, unspecified: Secondary | ICD-10-CM | POA: Diagnosis not present

## 2016-05-25 DIAGNOSIS — Z0000 Encounter for general adult medical examination without abnormal findings: Secondary | ICD-10-CM | POA: Diagnosis not present

## 2016-05-25 DIAGNOSIS — I10 Essential (primary) hypertension: Secondary | ICD-10-CM | POA: Diagnosis not present

## 2016-05-25 DIAGNOSIS — C61 Malignant neoplasm of prostate: Secondary | ICD-10-CM | POA: Diagnosis not present

## 2016-05-25 DIAGNOSIS — S161XXD Strain of muscle, fascia and tendon at neck level, subsequent encounter: Secondary | ICD-10-CM | POA: Diagnosis not present

## 2016-05-25 DIAGNOSIS — K648 Other hemorrhoids: Secondary | ICD-10-CM | POA: Diagnosis not present

## 2016-06-02 DIAGNOSIS — M7541 Impingement syndrome of right shoulder: Secondary | ICD-10-CM | POA: Diagnosis not present

## 2016-06-02 DIAGNOSIS — S161XXD Strain of muscle, fascia and tendon at neck level, subsequent encounter: Secondary | ICD-10-CM | POA: Diagnosis not present

## 2016-08-10 DIAGNOSIS — M4722 Other spondylosis with radiculopathy, cervical region: Secondary | ICD-10-CM | POA: Diagnosis not present

## 2016-08-10 DIAGNOSIS — S161XXD Strain of muscle, fascia and tendon at neck level, subsequent encounter: Secondary | ICD-10-CM | POA: Diagnosis not present

## 2016-08-20 DIAGNOSIS — M4722 Other spondylosis with radiculopathy, cervical region: Secondary | ICD-10-CM | POA: Diagnosis not present

## 2016-08-26 DIAGNOSIS — M4722 Other spondylosis with radiculopathy, cervical region: Secondary | ICD-10-CM | POA: Diagnosis not present

## 2016-09-02 DIAGNOSIS — M4722 Other spondylosis with radiculopathy, cervical region: Secondary | ICD-10-CM | POA: Diagnosis not present

## 2016-09-15 DIAGNOSIS — S161XXD Strain of muscle, fascia and tendon at neck level, subsequent encounter: Secondary | ICD-10-CM | POA: Diagnosis not present

## 2016-09-15 DIAGNOSIS — M4722 Other spondylosis with radiculopathy, cervical region: Secondary | ICD-10-CM | POA: Diagnosis not present

## 2016-09-16 DIAGNOSIS — M4722 Other spondylosis with radiculopathy, cervical region: Secondary | ICD-10-CM | POA: Diagnosis not present

## 2016-11-26 DIAGNOSIS — L821 Other seborrheic keratosis: Secondary | ICD-10-CM | POA: Diagnosis not present

## 2016-11-26 DIAGNOSIS — D225 Melanocytic nevi of trunk: Secondary | ICD-10-CM | POA: Diagnosis not present

## 2016-11-26 DIAGNOSIS — L570 Actinic keratosis: Secondary | ICD-10-CM | POA: Diagnosis not present

## 2016-12-30 DIAGNOSIS — M6588 Other synovitis and tenosynovitis, other site: Secondary | ICD-10-CM | POA: Diagnosis not present

## 2017-01-24 DIAGNOSIS — G4733 Obstructive sleep apnea (adult) (pediatric): Secondary | ICD-10-CM | POA: Diagnosis not present

## 2017-01-24 DIAGNOSIS — L570 Actinic keratosis: Secondary | ICD-10-CM | POA: Diagnosis not present

## 2017-02-11 DIAGNOSIS — M6588 Other synovitis and tenosynovitis, other site: Secondary | ICD-10-CM | POA: Diagnosis not present

## 2017-02-11 DIAGNOSIS — M2042 Other hammer toe(s) (acquired), left foot: Secondary | ICD-10-CM | POA: Diagnosis not present

## 2017-03-30 DIAGNOSIS — M6208 Separation of muscle (nontraumatic), other site: Secondary | ICD-10-CM | POA: Diagnosis not present

## 2017-03-30 DIAGNOSIS — Z91038 Other insect allergy status: Secondary | ICD-10-CM | POA: Diagnosis not present

## 2017-04-17 DIAGNOSIS — S0990XA Unspecified injury of head, initial encounter: Secondary | ICD-10-CM | POA: Diagnosis not present

## 2017-04-17 DIAGNOSIS — S0101XA Laceration without foreign body of scalp, initial encounter: Secondary | ICD-10-CM | POA: Diagnosis not present

## 2017-07-06 DIAGNOSIS — E7800 Pure hypercholesterolemia, unspecified: Secondary | ICD-10-CM | POA: Diagnosis not present

## 2017-07-06 DIAGNOSIS — Z0000 Encounter for general adult medical examination without abnormal findings: Secondary | ICD-10-CM | POA: Diagnosis not present

## 2017-07-06 DIAGNOSIS — I10 Essential (primary) hypertension: Secondary | ICD-10-CM | POA: Diagnosis not present

## 2017-07-06 DIAGNOSIS — Z125 Encounter for screening for malignant neoplasm of prostate: Secondary | ICD-10-CM | POA: Diagnosis not present

## 2017-07-06 DIAGNOSIS — Z23 Encounter for immunization: Secondary | ICD-10-CM | POA: Diagnosis not present

## 2017-07-06 DIAGNOSIS — H6123 Impacted cerumen, bilateral: Secondary | ICD-10-CM | POA: Diagnosis not present

## 2017-08-23 DIAGNOSIS — K5730 Diverticulosis of large intestine without perforation or abscess without bleeding: Secondary | ICD-10-CM | POA: Diagnosis not present

## 2017-08-23 DIAGNOSIS — Z86010 Personal history of colonic polyps: Secondary | ICD-10-CM | POA: Diagnosis not present

## 2017-09-02 DIAGNOSIS — M1712 Unilateral primary osteoarthritis, left knee: Secondary | ICD-10-CM | POA: Diagnosis not present

## 2017-09-14 DIAGNOSIS — L821 Other seborrheic keratosis: Secondary | ICD-10-CM | POA: Diagnosis not present

## 2017-09-14 DIAGNOSIS — L570 Actinic keratosis: Secondary | ICD-10-CM | POA: Diagnosis not present

## 2017-11-30 DIAGNOSIS — Z85828 Personal history of other malignant neoplasm of skin: Secondary | ICD-10-CM | POA: Diagnosis not present

## 2017-11-30 DIAGNOSIS — L570 Actinic keratosis: Secondary | ICD-10-CM | POA: Diagnosis not present

## 2017-11-30 DIAGNOSIS — L821 Other seborrheic keratosis: Secondary | ICD-10-CM | POA: Diagnosis not present

## 2017-11-30 DIAGNOSIS — D225 Melanocytic nevi of trunk: Secondary | ICD-10-CM | POA: Diagnosis not present

## 2017-11-30 DIAGNOSIS — L813 Cafe au lait spots: Secondary | ICD-10-CM | POA: Diagnosis not present

## 2018-01-16 DIAGNOSIS — G4733 Obstructive sleep apnea (adult) (pediatric): Secondary | ICD-10-CM | POA: Diagnosis not present

## 2018-01-19 DIAGNOSIS — Z8546 Personal history of malignant neoplasm of prostate: Secondary | ICD-10-CM | POA: Diagnosis not present

## 2018-01-19 DIAGNOSIS — N528 Other male erectile dysfunction: Secondary | ICD-10-CM | POA: Diagnosis not present

## 2018-01-19 DIAGNOSIS — C61 Malignant neoplasm of prostate: Secondary | ICD-10-CM | POA: Diagnosis not present

## 2018-01-19 DIAGNOSIS — Z87898 Personal history of other specified conditions: Secondary | ICD-10-CM | POA: Diagnosis not present

## 2018-02-10 DIAGNOSIS — N528 Other male erectile dysfunction: Secondary | ICD-10-CM | POA: Diagnosis not present

## 2018-02-10 DIAGNOSIS — Z8546 Personal history of malignant neoplasm of prostate: Secondary | ICD-10-CM | POA: Diagnosis not present

## 2018-04-11 DIAGNOSIS — M9901 Segmental and somatic dysfunction of cervical region: Secondary | ICD-10-CM | POA: Diagnosis not present

## 2018-04-11 DIAGNOSIS — M5031 Other cervical disc degeneration,  high cervical region: Secondary | ICD-10-CM | POA: Diagnosis not present

## 2018-04-11 DIAGNOSIS — M25511 Pain in right shoulder: Secondary | ICD-10-CM | POA: Diagnosis not present

## 2018-04-12 DIAGNOSIS — M9901 Segmental and somatic dysfunction of cervical region: Secondary | ICD-10-CM | POA: Diagnosis not present

## 2018-04-12 DIAGNOSIS — M25511 Pain in right shoulder: Secondary | ICD-10-CM | POA: Diagnosis not present

## 2018-04-12 DIAGNOSIS — M5031 Other cervical disc degeneration,  high cervical region: Secondary | ICD-10-CM | POA: Diagnosis not present

## 2018-04-13 DIAGNOSIS — M25511 Pain in right shoulder: Secondary | ICD-10-CM | POA: Diagnosis not present

## 2018-04-13 DIAGNOSIS — M9901 Segmental and somatic dysfunction of cervical region: Secondary | ICD-10-CM | POA: Diagnosis not present

## 2018-04-13 DIAGNOSIS — M5031 Other cervical disc degeneration,  high cervical region: Secondary | ICD-10-CM | POA: Diagnosis not present

## 2018-04-17 DIAGNOSIS — M25511 Pain in right shoulder: Secondary | ICD-10-CM | POA: Diagnosis not present

## 2018-04-17 DIAGNOSIS — M9901 Segmental and somatic dysfunction of cervical region: Secondary | ICD-10-CM | POA: Diagnosis not present

## 2018-04-17 DIAGNOSIS — M5031 Other cervical disc degeneration,  high cervical region: Secondary | ICD-10-CM | POA: Diagnosis not present

## 2018-04-18 DIAGNOSIS — M5031 Other cervical disc degeneration,  high cervical region: Secondary | ICD-10-CM | POA: Diagnosis not present

## 2018-04-18 DIAGNOSIS — M9901 Segmental and somatic dysfunction of cervical region: Secondary | ICD-10-CM | POA: Diagnosis not present

## 2018-04-18 DIAGNOSIS — M25511 Pain in right shoulder: Secondary | ICD-10-CM | POA: Diagnosis not present

## 2018-04-24 DIAGNOSIS — J069 Acute upper respiratory infection, unspecified: Secondary | ICD-10-CM | POA: Diagnosis not present

## 2018-04-27 DIAGNOSIS — R509 Fever, unspecified: Secondary | ICD-10-CM | POA: Diagnosis not present

## 2018-04-27 DIAGNOSIS — R05 Cough: Secondary | ICD-10-CM | POA: Diagnosis not present

## 2018-04-28 ENCOUNTER — Ambulatory Visit
Admission: RE | Admit: 2018-04-28 | Discharge: 2018-04-28 | Disposition: A | Discharge status: Home / Self Care | Payer: Federal, State, Local not specified - PPO | Source: Ambulatory Visit | Attending: Family Medicine | Admitting: Family Medicine

## 2018-04-28 ENCOUNTER — Other Ambulatory Visit: Payer: Self-pay | Admitting: Family Medicine

## 2018-04-28 DIAGNOSIS — R05 Cough: Secondary | ICD-10-CM | POA: Diagnosis not present

## 2018-04-28 DIAGNOSIS — R059 Cough, unspecified: Secondary | ICD-10-CM

## 2018-05-02 DIAGNOSIS — M5031 Other cervical disc degeneration,  high cervical region: Secondary | ICD-10-CM | POA: Diagnosis not present

## 2018-05-02 DIAGNOSIS — M25511 Pain in right shoulder: Secondary | ICD-10-CM | POA: Diagnosis not present

## 2018-05-02 DIAGNOSIS — M9901 Segmental and somatic dysfunction of cervical region: Secondary | ICD-10-CM | POA: Diagnosis not present

## 2018-05-03 DIAGNOSIS — M5031 Other cervical disc degeneration,  high cervical region: Secondary | ICD-10-CM | POA: Diagnosis not present

## 2018-05-03 DIAGNOSIS — M25511 Pain in right shoulder: Secondary | ICD-10-CM | POA: Diagnosis not present

## 2018-05-03 DIAGNOSIS — M9901 Segmental and somatic dysfunction of cervical region: Secondary | ICD-10-CM | POA: Diagnosis not present

## 2018-05-08 DIAGNOSIS — M25511 Pain in right shoulder: Secondary | ICD-10-CM | POA: Diagnosis not present

## 2018-05-08 DIAGNOSIS — M5031 Other cervical disc degeneration,  high cervical region: Secondary | ICD-10-CM | POA: Diagnosis not present

## 2018-05-08 DIAGNOSIS — M9901 Segmental and somatic dysfunction of cervical region: Secondary | ICD-10-CM | POA: Diagnosis not present

## 2018-05-10 DIAGNOSIS — M5031 Other cervical disc degeneration,  high cervical region: Secondary | ICD-10-CM | POA: Diagnosis not present

## 2018-05-10 DIAGNOSIS — M9901 Segmental and somatic dysfunction of cervical region: Secondary | ICD-10-CM | POA: Diagnosis not present

## 2018-05-10 DIAGNOSIS — M25511 Pain in right shoulder: Secondary | ICD-10-CM | POA: Diagnosis not present

## 2018-05-15 DIAGNOSIS — M5031 Other cervical disc degeneration,  high cervical region: Secondary | ICD-10-CM | POA: Diagnosis not present

## 2018-05-15 DIAGNOSIS — M9901 Segmental and somatic dysfunction of cervical region: Secondary | ICD-10-CM | POA: Diagnosis not present

## 2018-05-15 DIAGNOSIS — M25511 Pain in right shoulder: Secondary | ICD-10-CM | POA: Diagnosis not present

## 2018-05-17 DIAGNOSIS — L570 Actinic keratosis: Secondary | ICD-10-CM | POA: Diagnosis not present

## 2018-05-17 DIAGNOSIS — C44111 Basal cell carcinoma of skin of unspecified eyelid, including canthus: Secondary | ICD-10-CM | POA: Diagnosis not present

## 2018-05-17 DIAGNOSIS — D485 Neoplasm of uncertain behavior of skin: Secondary | ICD-10-CM | POA: Diagnosis not present

## 2018-05-18 DIAGNOSIS — M25511 Pain in right shoulder: Secondary | ICD-10-CM | POA: Diagnosis not present

## 2018-05-18 DIAGNOSIS — G4733 Obstructive sleep apnea (adult) (pediatric): Secondary | ICD-10-CM | POA: Diagnosis not present

## 2018-05-18 DIAGNOSIS — M9901 Segmental and somatic dysfunction of cervical region: Secondary | ICD-10-CM | POA: Diagnosis not present

## 2018-05-18 DIAGNOSIS — M5031 Other cervical disc degeneration,  high cervical region: Secondary | ICD-10-CM | POA: Diagnosis not present

## 2018-05-23 DIAGNOSIS — M25511 Pain in right shoulder: Secondary | ICD-10-CM | POA: Diagnosis not present

## 2018-05-23 DIAGNOSIS — M9901 Segmental and somatic dysfunction of cervical region: Secondary | ICD-10-CM | POA: Diagnosis not present

## 2018-05-23 DIAGNOSIS — M5031 Other cervical disc degeneration,  high cervical region: Secondary | ICD-10-CM | POA: Diagnosis not present

## 2018-07-04 DIAGNOSIS — M5031 Other cervical disc degeneration,  high cervical region: Secondary | ICD-10-CM | POA: Diagnosis not present

## 2018-07-04 DIAGNOSIS — M9901 Segmental and somatic dysfunction of cervical region: Secondary | ICD-10-CM | POA: Diagnosis not present

## 2018-07-04 DIAGNOSIS — M25511 Pain in right shoulder: Secondary | ICD-10-CM | POA: Diagnosis not present

## 2018-07-24 DIAGNOSIS — S61412S Laceration without foreign body of left hand, sequela: Secondary | ICD-10-CM | POA: Diagnosis not present

## 2018-08-03 DIAGNOSIS — C61 Malignant neoplasm of prostate: Secondary | ICD-10-CM | POA: Diagnosis not present

## 2018-08-03 DIAGNOSIS — E7800 Pure hypercholesterolemia, unspecified: Secondary | ICD-10-CM | POA: Diagnosis not present

## 2018-08-03 DIAGNOSIS — I10 Essential (primary) hypertension: Secondary | ICD-10-CM | POA: Diagnosis not present

## 2018-08-03 DIAGNOSIS — Z23 Encounter for immunization: Secondary | ICD-10-CM | POA: Diagnosis not present

## 2018-08-03 DIAGNOSIS — N529 Male erectile dysfunction, unspecified: Secondary | ICD-10-CM | POA: Diagnosis not present

## 2018-08-09 DIAGNOSIS — M9901 Segmental and somatic dysfunction of cervical region: Secondary | ICD-10-CM | POA: Diagnosis not present

## 2018-08-09 DIAGNOSIS — M25511 Pain in right shoulder: Secondary | ICD-10-CM | POA: Diagnosis not present

## 2018-08-09 DIAGNOSIS — M5031 Other cervical disc degeneration,  high cervical region: Secondary | ICD-10-CM | POA: Diagnosis not present

## 2018-08-21 DIAGNOSIS — M79662 Pain in left lower leg: Secondary | ICD-10-CM | POA: Diagnosis not present

## 2018-08-22 DIAGNOSIS — C441192 Basal cell carcinoma of skin of left lower eyelid, including canthus: Secondary | ICD-10-CM | POA: Diagnosis not present

## 2018-08-29 DIAGNOSIS — C61 Malignant neoplasm of prostate: Secondary | ICD-10-CM | POA: Diagnosis not present

## 2018-08-29 DIAGNOSIS — I10 Essential (primary) hypertension: Secondary | ICD-10-CM | POA: Diagnosis not present

## 2018-08-29 DIAGNOSIS — E7800 Pure hypercholesterolemia, unspecified: Secondary | ICD-10-CM | POA: Diagnosis not present

## 2018-10-02 DIAGNOSIS — M5031 Other cervical disc degeneration,  high cervical region: Secondary | ICD-10-CM | POA: Diagnosis not present

## 2018-10-02 DIAGNOSIS — M9901 Segmental and somatic dysfunction of cervical region: Secondary | ICD-10-CM | POA: Diagnosis not present

## 2018-10-02 DIAGNOSIS — M25511 Pain in right shoulder: Secondary | ICD-10-CM | POA: Diagnosis not present

## 2018-10-26 DIAGNOSIS — R253 Fasciculation: Secondary | ICD-10-CM | POA: Diagnosis not present

## 2018-11-30 DIAGNOSIS — M9901 Segmental and somatic dysfunction of cervical region: Secondary | ICD-10-CM | POA: Diagnosis not present

## 2018-11-30 DIAGNOSIS — M5031 Other cervical disc degeneration,  high cervical region: Secondary | ICD-10-CM | POA: Diagnosis not present

## 2018-11-30 DIAGNOSIS — M25511 Pain in right shoulder: Secondary | ICD-10-CM | POA: Diagnosis not present

## 2018-12-26 DIAGNOSIS — L813 Cafe au lait spots: Secondary | ICD-10-CM | POA: Diagnosis not present

## 2018-12-26 DIAGNOSIS — L570 Actinic keratosis: Secondary | ICD-10-CM | POA: Diagnosis not present

## 2018-12-26 DIAGNOSIS — D225 Melanocytic nevi of trunk: Secondary | ICD-10-CM | POA: Diagnosis not present

## 2018-12-26 DIAGNOSIS — Z23 Encounter for immunization: Secondary | ICD-10-CM | POA: Diagnosis not present

## 2018-12-26 DIAGNOSIS — Z85828 Personal history of other malignant neoplasm of skin: Secondary | ICD-10-CM | POA: Diagnosis not present

## 2019-01-01 DIAGNOSIS — H6123 Impacted cerumen, bilateral: Secondary | ICD-10-CM | POA: Diagnosis not present

## 2019-01-01 DIAGNOSIS — A084 Viral intestinal infection, unspecified: Secondary | ICD-10-CM | POA: Diagnosis not present

## 2019-01-14 DIAGNOSIS — S61412A Laceration without foreign body of left hand, initial encounter: Secondary | ICD-10-CM | POA: Diagnosis not present

## 2019-01-14 DIAGNOSIS — S60222A Contusion of left hand, initial encounter: Secondary | ICD-10-CM | POA: Diagnosis not present

## 2019-01-22 DIAGNOSIS — Z23 Encounter for immunization: Secondary | ICD-10-CM | POA: Diagnosis not present

## 2019-01-31 DIAGNOSIS — G4733 Obstructive sleep apnea (adult) (pediatric): Secondary | ICD-10-CM | POA: Diagnosis not present

## 2019-02-12 ENCOUNTER — Other Ambulatory Visit: Payer: Self-pay | Admitting: Family Medicine

## 2019-02-12 ENCOUNTER — Ambulatory Visit
Admission: RE | Admit: 2019-02-12 | Discharge: 2019-02-12 | Disposition: A | Discharge status: Home / Self Care | Payer: Federal, State, Local not specified - PPO | Source: Ambulatory Visit | Attending: Family Medicine | Admitting: Family Medicine

## 2019-02-12 ENCOUNTER — Other Ambulatory Visit: Payer: Self-pay

## 2019-02-12 DIAGNOSIS — R1031 Right lower quadrant pain: Secondary | ICD-10-CM

## 2019-02-12 MED ORDER — IOPAMIDOL (ISOVUE-300) INJECTION 61%
100.0000 mL | Freq: Once | INTRAVENOUS | Status: AC | PRN
  Administered 2019-02-12: 100 mL via INTRAVENOUS

## 2019-03-01 DIAGNOSIS — N3281 Overactive bladder: Secondary | ICD-10-CM | POA: Diagnosis not present

## 2019-03-01 DIAGNOSIS — Z8546 Personal history of malignant neoplasm of prostate: Secondary | ICD-10-CM | POA: Diagnosis not present

## 2019-03-01 DIAGNOSIS — R399 Unspecified symptoms and signs involving the genitourinary system: Secondary | ICD-10-CM | POA: Diagnosis not present

## 2019-03-01 DIAGNOSIS — N528 Other male erectile dysfunction: Secondary | ICD-10-CM | POA: Diagnosis not present

## 2019-03-21 DIAGNOSIS — G4733 Obstructive sleep apnea (adult) (pediatric): Secondary | ICD-10-CM | POA: Diagnosis not present

## 2019-03-22 DIAGNOSIS — G4733 Obstructive sleep apnea (adult) (pediatric): Secondary | ICD-10-CM | POA: Diagnosis not present

## 2019-12-30 IMAGING — CR DG CHEST 2V
2 series · 2 of 2 positions shown · non-contrast
Comparison: Chest x-ray 01/01/2016.

CLINICAL DATA: Cough and congestion.  High fevers.

EXAM:
CHEST - 2 VIEW

[w chest pa]
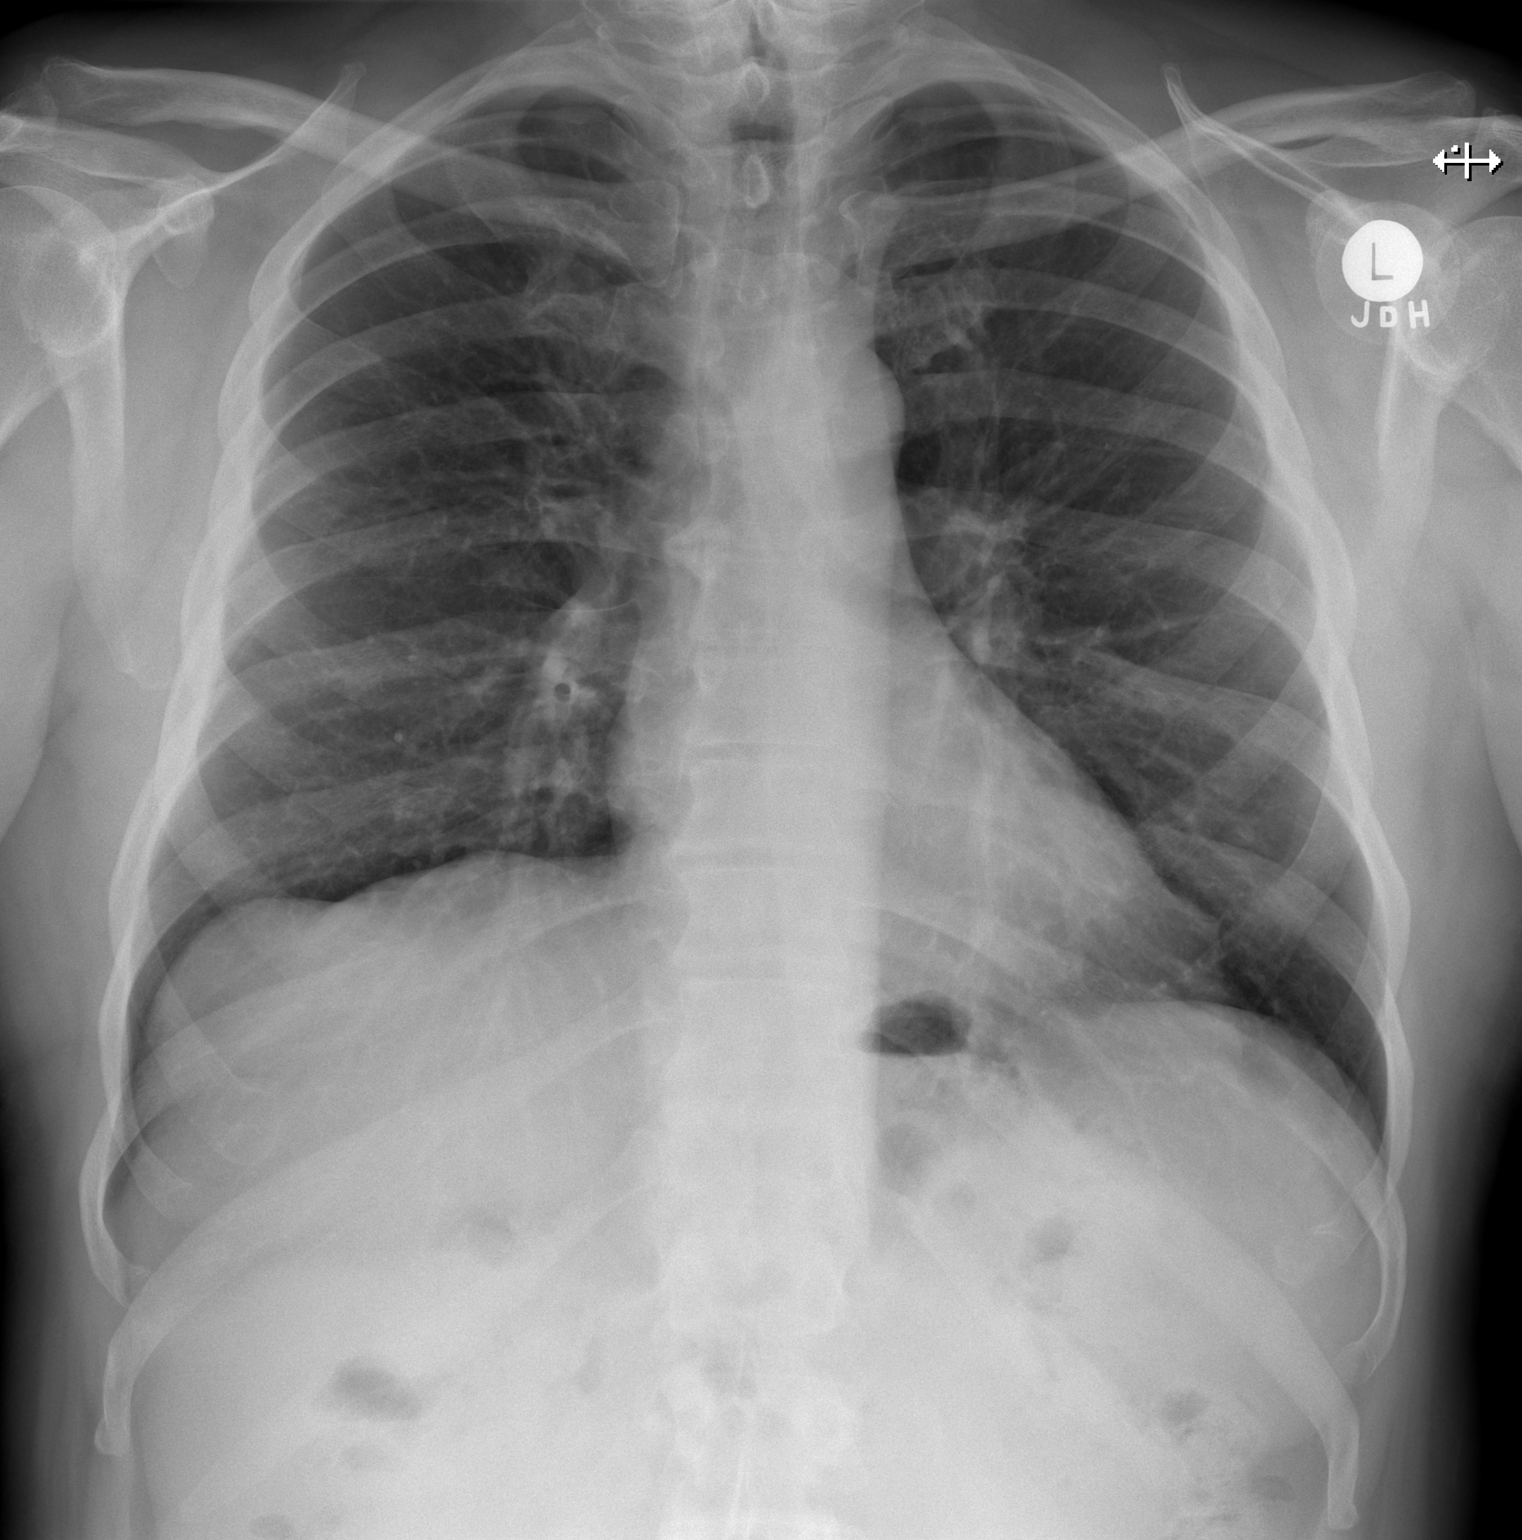

[w chest lat]
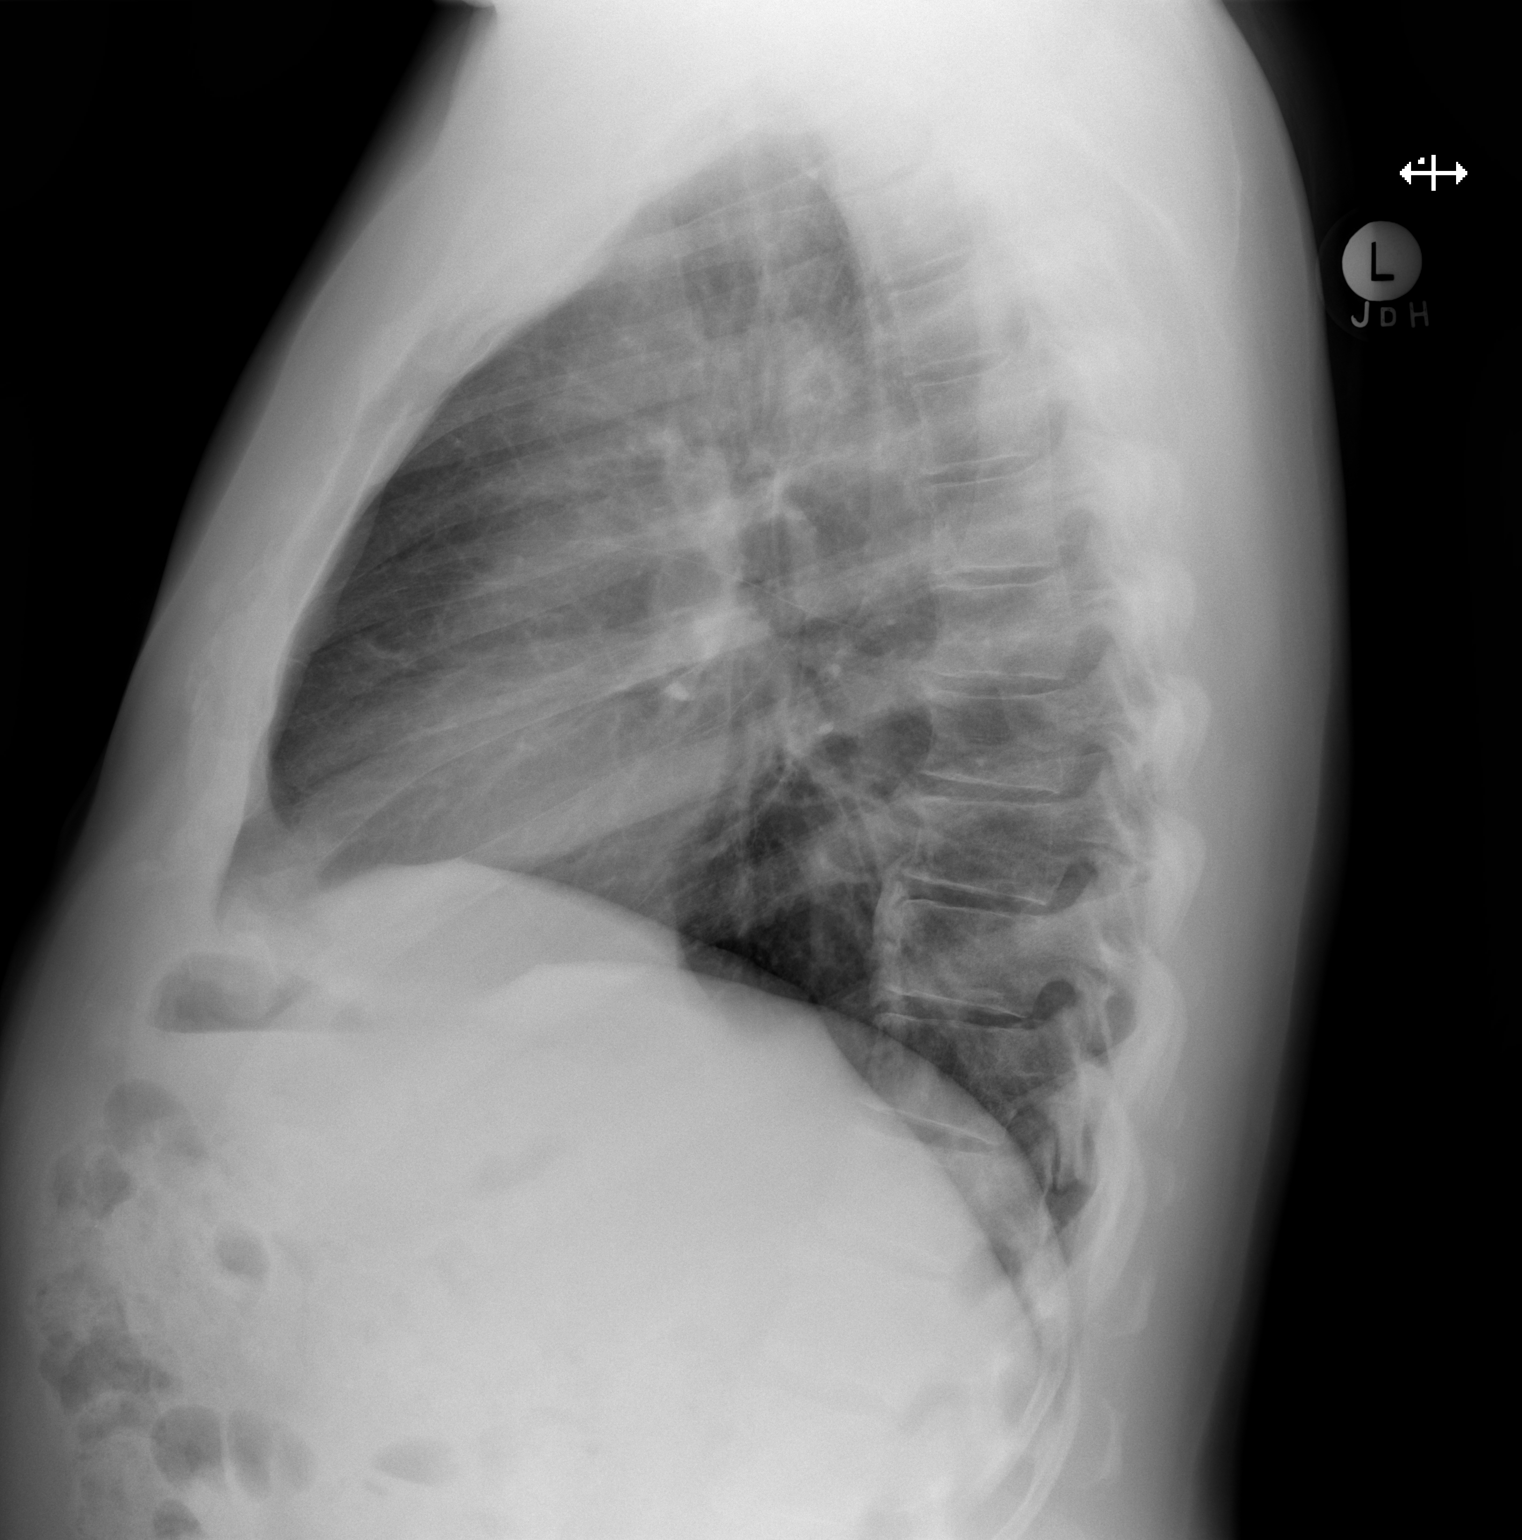

[2 of 2 positions shown; findings below may reference images not displayed]

FINDINGS: Mediastinum hilar structures normal. Heart size normal. Mild
peribronchial cuffing. Mild bronchitis cannot be excluded. Mild
basilar subsegmental atelectasis. No focal infiltrate. No pleural
effusion or pneumothorax. Degenerative change thoracic spine.
IMPRESSION: Mild peribronchial cuffing. Mild bronchitis cannot be excluded. Mild
basilar subsegmental atelectasis. No focal infiltrate identified.

## 2020-03-24 ENCOUNTER — Other Ambulatory Visit: Payer: Self-pay

## 2020-03-24 ENCOUNTER — Other Ambulatory Visit: Payer: Self-pay | Admitting: Physician Assistant

## 2020-03-24 ENCOUNTER — Ambulatory Visit
Admission: RE | Admit: 2020-03-24 | Discharge: 2020-03-24 | Disposition: A | Discharge status: Home / Self Care | Payer: Medicare Other | Source: Ambulatory Visit | Attending: Physician Assistant | Admitting: Physician Assistant

## 2020-03-24 DIAGNOSIS — R1031 Right lower quadrant pain: Secondary | ICD-10-CM

## 2020-03-24 MED ORDER — IOPAMIDOL (ISOVUE-300) INJECTION 61%
100.0000 mL | Freq: Once | INTRAVENOUS | Status: AC | PRN
  Administered 2020-03-24: 100 mL via INTRAVENOUS

## 2020-06-05 ENCOUNTER — Ambulatory Visit: Payer: Self-pay | Admitting: Surgery

## 2020-06-05 NOTE — H&P (View-Only) (Signed)
Surgical Evaluation Requesting provider: Dr. Alyson Ingles  Chief Complaint: abdominal pain  HPI: Very pleasant 64 year old gentleman who has been experiencing right upper quadrant abdominal pain essentially constantly for the last few months.  This began after a course of antibiotics to treat his first and only episode of uncomplicated diverticulitis.  He has a 5 mm gallstone demonstrated on 2 serial CT scans, as well as mild hepatic steatosis but no other intra-abdominal findings to explain his right upper quadrant pain.  He states that this feels like a pressure and ache, at times the area feels like it is bloated or tight.  The pain has been severe at times, most recently after eating fried fish.  It does seem to be aggravated by eating in general.  On occasion it has been associated with nausea.  No vomiting.  No fevers or jaundice.  He has not had any prior abdominal surgery.  Allergies  Allergen Reactions   Codeine Rash    Tolerates other opiates.    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  Social History   Socioeconomic History   Marital status: Married    Spouse name: Not on file   Number of children: Not on file   Years of education: Not on file   Highest  education level: Not on file  Occupational History   Not on file  Tobacco Use   Smoking status: Never Smoker   Smokeless tobacco: Never Used  Substance and Sexual Activity   Alcohol use: Yes    Comment: occasional    Drug use: No   Sexual activity: Not on file  Other Topics Concern   Not on file  Social History Narrative   Not on file   Social Determinants of Health   Financial Resource Strain:    Difficulty of Paying Living Expenses:   Food Insecurity:    Worried About [place] in the Last Year:    Arboriculturist in the Last Year:   Transportation Needs:    Film/video editor (Medical):    Lack of Transportation (Non-Medical):   Physical Activity:    Days of Exercise per Week:    Minutes of Exercise per Session:   Stress:    Feeling of Stress :   Social Connections:    Frequency of Communication with Friends and Family:    Frequency of Social Gatherings with Friends and Family:    Attends Religious Services:    Active Member of Clubs or Organizations:    Attends Archivist Meetings:    Marital Status:  Current Outpatient Medications on File Prior to Visit  Medication Sig Dispense Refill   aspirin EC 81 MG tablet Take 81 mg by mouth daily.     pravastatin (PRAVACHOL) 40 MG tablet Take 40 mg by mouth daily at 6 PM.     telmisartan (MICARDIS) 20 MG tablet Take 20 mg by mouth daily.     No current facility-administered medications on file prior to visit.    Review of Systems: a complete, 10pt review of systems was completed with pertinent positives and negatives as documented in the HPI  Physical Exam:  Weight: 211.8 lb   Height: 70.5 in  Body Surface Area: 2.15 m   Body Mass Index: 29.96 kg/m   Temp.: 98 F (Temporal)    Pulse: 59 (Regular)    P.OX: 97% (Room air) BP: 168/74(Sitting, Left Arm, Standard)  Alert and well-appearing.  Unlabored respirations Abdomen is soft, nondistended.  There is focal tenderness in the right  subcostal margin and epigastrium.   CBC Latest Ref Rng & Units 07/01/2014 10/21/2010  WBC 4.0 - 10.5 K/uL 5.5 7.5  Hemoglobin 13.0 - 17.0 g/dL 13.8 13.9  Hematocrit 39 - 52 % 41.4 41.7  Platelets 150 - 400 K/uL 205 165    CMP Latest Ref Rng & Units 07/01/2014 10/21/2010  Glucose 70 - 99 mg/dL 107(H) 97  BUN 6 - 23 mg/dL 14 13  Creatinine 0.50 - 1.35 mg/dL 0.99 1.15  Sodium 137 - 147 mEq/L 140 137  Potassium 3.7 - 5.3 mEq/L 4.4 3.7  Chloride 96 - 112 mEq/L 102 103  CO2 19 - 32 mEq/L 25 26  Calcium 8.4 - 10.5 mg/dL 9.3 9.0  Total Protein 6.0 - 8.3 g/dL - 6.8  Total Bilirubin 0.3 - 1.2 mg/dL - 0.5  Alkaline Phos 39 - 117 U/L - 58  AST 0 - 37 U/L - 28  ALT 0 - 53 U/L - 22    No results found for: INR, PROTIME  Imaging: No results found.   A/P: Symptoms are consistent with biliary colic. We went over the relevant anatomy and pathology using a diagram to demonstrate. I recommend proceeding with laparoscopic or robotic cholecystectomy with possible cholangiogram. Discussed risks of surgery including bleeding, pain, scarring, intraabdominal injury specifically to the common bile duct and sequelae, bile leak, conversion to open surgery, blood clot, pneumonia, heart attack, stroke, failure to resolve symptoms, etc. Questions welcomed and answered. He wishes to proceed with surgery as soon as possible.    Patient Active Problem List   Diagnosis Date Noted   Acute medial meniscal tear 07/03/2014       Romana Juniper, MD [facility]  See AMION to contact appropriate on-call provider

## 2020-06-05 NOTE — H&P (Signed)
Surgical Evaluation Requesting provider: Dr. Alyson Ingles  Chief Complaint: abdominal pain  HPI: Very pleasant 64 year old gentleman who has been experiencing right upper quadrant abdominal pain essentially constantly for the last few months.  This began after a course of antibiotics to treat his first and only episode of uncomplicated diverticulitis.  He has a 5 mm gallstone demonstrated on 2 serial CT scans, as well as mild hepatic steatosis but no other intra-abdominal findings to explain his right upper quadrant pain.  He states that this feels like a pressure and ache, at times the area feels like it is bloated or tight.  The pain has been severe at times, most recently after eating fried fish.  It does seem to be aggravated by eating in general.  On occasion it has been associated with nausea.  No vomiting.  No fevers or jaundice.  He has not had any prior abdominal surgery.  Allergies  Allergen Reactions   Codeine Rash    Tolerates other opiates.    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  Social History   Socioeconomic History   Marital status: Married    Spouse name: Not on file   Number of children: Not on file   Years of education: Not on file   Highest  education level: Not on file  Occupational History   Not on file  Tobacco Use   Smoking status: Never Smoker   Smokeless tobacco: Never Used  Substance and Sexual Activity   Alcohol use: Yes    Comment: occasional    Drug use: No   Sexual activity: Not on file  Other Topics Concern   Not on file  Social History Narrative   Not on file   Social Determinants of Health   Financial Resource Strain:    Difficulty of Paying Living Expenses:   Food Insecurity:    Worried About Independence in the Last Year:    Arboriculturist in the Last Year:   Transportation Needs:    Film/video editor (Medical):    Lack of Transportation (Non-Medical):   Physical Activity:    Days of Exercise per Week:    Minutes of Exercise per Session:   Stress:    Feeling of Stress :   Social Connections:    Frequency of Communication with Friends and Family:    Frequency of Social Gatherings with Friends and Family:    Attends Religious Services:    Active Member of Clubs or Organizations:    Attends Archivist Meetings:    Marital Status:  Current Outpatient Medications on File Prior to Visit  Medication Sig Dispense Refill   aspirin EC 81 MG tablet Take 81 mg by mouth daily.     pravastatin (PRAVACHOL) 40 MG tablet Take 40 mg by mouth daily at 6 PM.     telmisartan (MICARDIS) 20 MG tablet Take 20 mg by mouth daily.     No current facility-administered medications on file prior to visit.    Review of Systems: a complete, 10pt review of systems was completed with pertinent positives and negatives as documented in the HPI  Physical Exam:  Weight: 211.8 lb   Height: 70.5 in  Body Surface Area: 2.15 m   Body Mass Index: 29.96 kg/m   Temp.: 98 F (Temporal)    Pulse: 59 (Regular)    P.OX: 97% (Room air) BP: 168/74(Sitting, Left Arm, Standard)  Alert and well-appearing.  Unlabored respirations Abdomen is soft, nondistended.  There is focal tenderness in the right  subcostal margin and epigastrium.   CBC Latest Ref Rng & Units 07/01/2014 10/21/2010  WBC 4.0 - 10.5 K/uL 5.5 7.5  Hemoglobin 13.0 - 17.0 g/dL 13.8 13.9  Hematocrit 39 - 52 % 41.4 41.7  Platelets 150 - 400 K/uL 205 165    CMP Latest Ref Rng & Units 07/01/2014 10/21/2010  Glucose 70 - 99 mg/dL 107(H) 97  BUN 6 - 23 mg/dL 14 13  Creatinine 0.50 - 1.35 mg/dL 0.99 1.15  Sodium 137 - 147 mEq/L 140 137  Potassium 3.7 - 5.3 mEq/L 4.4 3.7  Chloride 96 - 112 mEq/L 102 103  CO2 19 - 32 mEq/L 25 26  Calcium 8.4 - 10.5 mg/dL 9.3 9.0  Total Protein 6.0 - 8.3 g/dL - 6.8  Total Bilirubin 0.3 - 1.2 mg/dL - 0.5  Alkaline Phos 39 - 117 U/L - 58  AST 0 - 37 U/L - 28  ALT 0 - 53 U/L - 22    No results found for: INR, PROTIME  Imaging: No results found.   A/P: Symptoms are consistent with biliary colic. We went over the relevant anatomy and pathology using a diagram to demonstrate. I recommend proceeding with laparoscopic or robotic cholecystectomy with possible cholangiogram. Discussed risks of surgery including bleeding, pain, scarring, intraabdominal injury specifically to the common bile duct and sequelae, bile leak, conversion to open surgery, blood clot, pneumonia, heart attack, stroke, failure to resolve symptoms, etc. Questions welcomed and answered. He wishes to proceed with surgery as soon as possible.    Patient Active Problem List   Diagnosis Date Noted   Acute medial meniscal tear 07/03/2014       Romana Juniper, MD [facility]  See AMION to contact appropriate on-call provider

## 2020-06-06 ENCOUNTER — Other Ambulatory Visit (HOSPITAL_COMMUNITY)
Admission: RE | Admit: 2020-06-06 | Discharge: 2020-06-06 | Disposition: A | Discharge status: Home / Self Care | Payer: Medicare Other | Source: Ambulatory Visit | Attending: Surgery | Admitting: Surgery

## 2020-06-06 ENCOUNTER — Encounter (HOSPITAL_COMMUNITY): Payer: Self-pay

## 2020-06-06 DIAGNOSIS — Z20822 Contact with and (suspected) exposure to covid-19: Secondary | ICD-10-CM | POA: Diagnosis not present

## 2020-06-06 DIAGNOSIS — Z01812 Encounter for preprocedural laboratory examination: Secondary | ICD-10-CM | POA: Diagnosis present

## 2020-06-06 LAB — SARS CORONAVIRUS 2 (TAT 6-24 HRS): SARS Coronavirus 2: NEGATIVE

## 2020-06-06 NOTE — Progress Notes (Addendum)
COVID Vaccine Completed: Yes Date COVID Vaccine completed:  unsure COVID vaccine manufacturer: Pfizer      PCP - Dr. Lenard Simmer Cardiologist - N/A  Chest x-ray - greater than 1 year EKG - 06/09/2020 in epic (pre op) Stress Test - greater than 2 years ECHO - greater than 2 years Cardiac Cath - N/A  Sleep Study - Yes CPAP - Yes  Fasting Blood Sugar - N/A Checks Blood Sugar __N/A ___ times a day  Blood Thinner Instructions: N/A Aspirin Instructions:  No Last Dose: 06/08/2020  Anesthesia review: N/A  Patient denies shortness of breath, fever, cough and chest pain at PAT appointment   Patient verbalized understanding of instructions that were given to them at the PAT appointment. Patient was also instructed that they will need to review over the PAT instructions again at home before surgery.

## 2020-06-06 NOTE — Patient Instructions (Signed)
DUE TO COVID-19 ONLY ONE VISITOR IS ALLOWED TO COME WITH YOU AND STAY IN THE WAITING ROOM ONLY DURING PRE OP AND PROCEDURE.   IF YOU WILL BE ADMITTED INTO THE HOSPITAL YOU ARE ALLOWED ONE SUPPORT PERSON DURING VISITATION HOURS ONLY (10AM -8PM)   . The support person may change daily. . The support person must pass our screening, gel in and out, and wear a mask at all times, including in the patient's room. . Patients must also wear a mask when staff or their support person are in the room.   COVID SWAB TESTING COMPLETED ON:  Friday, Aug. 6, 2021 (Must self quarantine after testing. Follow instructions on handout.)       Your procedure is scheduled on: Tuesday, Aug. 10, 2021   Report to [facility] Main  Entrance    Report to admitting at 10:00 AM   Call this number if you have problems the morning of surgery [phone]   Do not eat food :After Midnight.   May have liquids until 9:00 AM   day of surgery  CLEAR LIQUID DIET  Foods Allowed                                                                     Foods Excluded  Water, Black Coffee and tea, regular and decaf                             liquids that you cannot  Plain Jell-O in any flavor  (No red)                                           see through such as: Fruit ices (not with fruit pulp)                                     milk, soups, orange juice              Iced Popsicles (No red)                                    All solid food                                   Apple juices Sports drinks like Gatorade (No red) Lightly seasoned clear broth or consume(fat free) Sugar, honey syrup  Sample Menu Breakfast                                Lunch                                     Supper Cranberry juice  Beef broth                            Chicken broth Jell-O                                     Grape juice                           Apple juice Coffee or tea                        Jell-O                                       Popsicle                                                Coffee or tea                        Coffee or tea      Oral Hygiene is also important to reduce your risk of infection.                                    Remember - BRUSH YOUR TEETH THE MORNING OF SURGERY WITH YOUR REGULAR TOOTHPASTE   Do NOT smoke after Midnight   Take these medicines the morning of surgery with A SIP OF WATER: None  Bring CPAP mask and tubing day of surgery                               You may not have any metal on your body including jewelry, and body piercings             Do not wear lotions, powders, perfumes/cologne, or deodorant                           Men may shave face and neck.   Do not bring valuables to the hospital. [facility].   Contacts, dentures or bridgework may not be worn into surgery.    Patients discharged the day of surgery will not be allowed to drive home.   Special Instructions: Bring a copy of your healthcare power of attorney and living will documents         the day of surgery if you haven't scanned them in before.              Please read over the following fact sheets you were given: IF YOU HAVE QUESTIONS ABOUT YOUR PRE OP INSTRUCTIONS PLEASE CALL [phone]   [place] - Preparing for Surgery Before surgery, you can play an important role.  Because skin is not sterile, your skin needs to be as free of germs as possible.  You can reduce the number of  germs on your skin by washing with CHG (chlorahexidine gluconate) soap before surgery.  CHG is an antiseptic cleaner which kills germs and bonds with the skin to continue killing germs even after washing. Please DO NOT use if you have an allergy to CHG or antibacterial soaps.  If your skin becomes reddened/irritated stop using the CHG and inform your nurse when you arrive at Short Stay. Do not shave (including legs and underarms) for at least 48  hours prior to the first CHG shower.  You may shave your face/neck.  Please follow these instructions carefully:  1.  Shower with CHG Soap the night before surgery and the  morning of surgery.  2.  If you choose to wash your hair, wash your hair first as usual with your normal  shampoo.  3.  After you shampoo, rinse your hair and body thoroughly to remove the shampoo.                             4.  Use CHG as you would any other liquid soap.  You can apply chg directly to the skin and wash.  Gently with a scrungie or clean washcloth.  5.  Apply the CHG Soap to your body ONLY FROM THE NECK DOWN.   Do   not use on face/ open                           Wound or open sores. Avoid contact with eyes, ears mouth and   genitals (private parts).                       Wash face,  Genitals (private parts) with your normal soap.             6.  Wash thoroughly, paying special attention to the area where your    surgery  will be performed.  7.  Thoroughly rinse your body with warm water from the neck down.  8.  DO NOT shower/wash with your normal soap after using and rinsing off the CHG Soap.                9.  Pat yourself dry with a clean towel.            10.  Wear clean pajamas.            11.  Place clean sheets on your bed the night of your first shower and do not  sleep with pets. Day of Surgery : Do not apply any lotions/deodorants the morning of surgery.  Please wear clean clothes to the hospital/surgery center.  FAILURE TO FOLLOW THESE INSTRUCTIONS MAY RESULT IN THE CANCELLATION OF YOUR SURGERY  PATIENT SIGNATURE_________________________________  NURSE SIGNATURE__________________________________  ________________________________________________________________________

## 2020-06-09 ENCOUNTER — Other Ambulatory Visit: Payer: Self-pay

## 2020-06-09 ENCOUNTER — Encounter (HOSPITAL_COMMUNITY): Payer: Self-pay

## 2020-06-09 ENCOUNTER — Encounter (HOSPITAL_COMMUNITY)
Admission: RE | Admit: 2020-06-09 | Discharge: 2020-06-09 | Disposition: A | Discharge status: Home / Self Care | Payer: Medicare Other | Source: Ambulatory Visit | Attending: Surgery | Admitting: Surgery

## 2020-06-09 DIAGNOSIS — Z01818 Encounter for other preprocedural examination: Secondary | ICD-10-CM | POA: Diagnosis present

## 2020-06-09 DIAGNOSIS — I4510 Unspecified right bundle-branch block: Secondary | ICD-10-CM

## 2020-06-09 HISTORY — DX: Malignant neoplasm of prostate: C61

## 2020-06-09 HISTORY — DX: Pneumonia, unspecified organism: J18.9

## 2020-06-09 HISTORY — DX: Fatty (change of) liver, not elsewhere classified: K76.0

## 2020-06-09 HISTORY — DX: Unspecified right bundle-branch block: I45.10

## 2020-06-09 HISTORY — DX: Obstructive sleep apnea (adult) (pediatric): Z99.89

## 2020-06-09 HISTORY — DX: Obstructive sleep apnea (adult) (pediatric): G47.33

## 2020-06-09 LAB — BASIC METABOLIC PANEL
Anion gap: 6 (ref 5–15)
BUN: 17 mg/dL (ref 8–23)
CO2: 27 mmol/L (ref 22–32)
Calcium: 9.1 mg/dL (ref 8.9–10.3)
Chloride: 105 mmol/L (ref 98–111)
Creatinine, Ser: 0.88 mg/dL (ref 0.61–1.24)
GFR calc Af Amer: >60 mL/min (ref >60)
GFR calc non Af Amer: >60 mL/min (ref >60)
Glucose, Bld: 98 mg/dL (ref 70–99)
Potassium: 4.9 mmol/L (ref 3.5–5.1)
Sodium: 138 mmol/L (ref 135–145)

## 2020-06-09 LAB — CBC
HCT: 44.1 % (ref 39.0–52.0)
Hemoglobin: 14 g/dL (ref 13.0–17.0)
MCH: 28.1 pg (ref 26.0–34.0)
MCHC: 31.7 g/dL (ref 30.0–36.0)
MCV: 88.4 fL (ref 80.0–100.0)
Platelets: 164 10*3/uL (ref 150–400)
RBC: 4.99 MIL/uL (ref 4.22–5.81)
RDW: 14.2 % (ref 11.5–15.5)
WBC: 5.8 10*3/uL (ref 4.0–10.5)
nRBC: 0 % (ref 0.0–0.2)

## 2020-06-09 MED ORDER — BUPIVACAINE LIPOSOME 1.3 % IJ SUSP
20.0000 mL | Freq: Once | INTRAMUSCULAR | Status: DC
  Filled 2020-06-09: qty 20

## 2020-06-10 ENCOUNTER — Encounter (HOSPITAL_COMMUNITY): Payer: Self-pay | Admitting: Surgery

## 2020-06-10 ENCOUNTER — Ambulatory Visit (HOSPITAL_COMMUNITY): Payer: Medicare Other | Admitting: Certified Registered Nurse Anesthetist

## 2020-06-10 ENCOUNTER — Other Ambulatory Visit: Payer: Self-pay

## 2020-06-10 ENCOUNTER — Encounter (HOSPITAL_COMMUNITY)
Admission: RE | Disposition: A | Discharge status: Home / Self Care | Payer: Self-pay | Source: Home / Self Care | Attending: Surgery

## 2020-06-10 ENCOUNTER — Ambulatory Visit (HOSPITAL_COMMUNITY)
Admission: RE | Admit: 2020-06-10 | Discharge: 2020-06-10 | Disposition: A | Discharge status: Home / Self Care | Payer: Medicare Other | Attending: Surgery | Admitting: Surgery

## 2020-06-10 DIAGNOSIS — I10 Essential (primary) hypertension: Secondary | ICD-10-CM | POA: Insufficient documentation

## 2020-06-10 DIAGNOSIS — Z79899 Other long term (current) drug therapy: Secondary | ICD-10-CM | POA: Insufficient documentation

## 2020-06-10 DIAGNOSIS — Z7982 Long term (current) use of aspirin: Secondary | ICD-10-CM | POA: Diagnosis not present

## 2020-06-10 DIAGNOSIS — Z85828 Personal history of other malignant neoplasm of skin: Secondary | ICD-10-CM | POA: Diagnosis not present

## 2020-06-10 DIAGNOSIS — G4730 Sleep apnea, unspecified: Secondary | ICD-10-CM | POA: Diagnosis not present

## 2020-06-10 DIAGNOSIS — K811 Chronic cholecystitis: Secondary | ICD-10-CM | POA: Insufficient documentation

## 2020-06-10 DIAGNOSIS — Z8546 Personal history of malignant neoplasm of prostate: Secondary | ICD-10-CM | POA: Diagnosis not present

## 2020-06-10 DIAGNOSIS — E785 Hyperlipidemia, unspecified: Secondary | ICD-10-CM | POA: Insufficient documentation

## 2020-06-10 DIAGNOSIS — R1011 Right upper quadrant pain: Secondary | ICD-10-CM | POA: Diagnosis present

## 2020-06-10 SURGERY — CHOLECYSTECTOMY, ROBOT-ASSISTED, LAPAROSCOPIC
Anesthesia: General | Site: Abdomen

## 2020-06-10 MED ORDER — INDOCYANINE GREEN 25 MG IV SOLR
7.5000 mg | Freq: Once | INTRAVENOUS | Status: AC
  Administered 2020-06-10: 7.5 mg via INTRAVENOUS
  Filled 2020-06-10: qty 3

## 2020-06-10 MED ORDER — FENTANYL CITRATE (PF) 100 MCG/2ML IJ SOLN
25.0000 ug | INTRAMUSCULAR | Status: DC | PRN
  Administered 2020-06-10: 50 ug via INTRAVENOUS
  Administered 2020-06-10: 25 ug via INTRAVENOUS

## 2020-06-10 MED ORDER — CHLORHEXIDINE GLUCONATE 4 % EX LIQD: 60.0000 mL | Freq: Once | CUTANEOUS | Status: DC

## 2020-06-10 MED ORDER — ORAL CARE MOUTH RINSE: 15.0000 mL | Freq: Once | OROMUCOSAL | Status: AC

## 2020-06-10 MED ORDER — CHLORHEXIDINE GLUCONATE 0.12 % MT SOLN
15.0000 mL | Freq: Once | OROMUCOSAL | Status: AC
  Administered 2020-06-10: 15 mL via OROMUCOSAL

## 2020-06-10 MED ORDER — PROPOFOL 10 MG/ML IV BOLUS
INTRAVENOUS | Status: AC
  Filled 2020-06-10: qty 20

## 2020-06-10 MED ORDER — TRAMADOL HCL 50 MG PO TABS: 50.0000 mg | ORAL_TABLET | Freq: Four times a day (QID) | ORAL | 0 refills | Status: AC | PRN

## 2020-06-10 MED ORDER — OXYCODONE HCL 5 MG PO TABS
ORAL_TABLET | ORAL | Status: AC
  Administered 2020-06-10: 5 mg via ORAL
  Filled 2020-06-10: qty 1

## 2020-06-10 MED ORDER — MIDAZOLAM HCL 2 MG/2ML IJ SOLN
INTRAMUSCULAR | Status: DC | PRN
  Administered 2020-06-10: 2 mg via INTRAVENOUS

## 2020-06-10 MED ORDER — SUGAMMADEX SODIUM 200 MG/2ML IV SOLN
INTRAVENOUS | Status: DC | PRN
  Administered 2020-06-10: 200 mg via INTRAVENOUS

## 2020-06-10 MED ORDER — EPHEDRINE SULFATE-NACL 50-0.9 MG/10ML-% IV SOSY
PREFILLED_SYRINGE | INTRAVENOUS | Status: DC | PRN
  Administered 2020-06-10: 10 mg via INTRAVENOUS

## 2020-06-10 MED ORDER — BUPIVACAINE-EPINEPHRINE 0.25% -1:200000 IJ SOLN
INTRAMUSCULAR | Status: DC | PRN
  Administered 2020-06-10: 30 mL

## 2020-06-10 MED ORDER — DEXAMETHASONE SODIUM PHOSPHATE 10 MG/ML IJ SOLN
INTRAMUSCULAR | Status: DC | PRN
  Administered 2020-06-10: 5 mg via INTRAVENOUS

## 2020-06-10 MED ORDER — ACETAMINOPHEN 500 MG PO TABS
1000.0000 mg | ORAL_TABLET | ORAL | Status: AC
  Administered 2020-06-10: 1000 mg via ORAL
  Filled 2020-06-10: qty 2

## 2020-06-10 MED ORDER — LIDOCAINE HCL (CARDIAC) PF 100 MG/5ML IV SOSY
PREFILLED_SYRINGE | INTRAVENOUS | Status: DC | PRN
  Administered 2020-06-10: 100 mg via INTRAVENOUS

## 2020-06-10 MED ORDER — ACETAMINOPHEN 650 MG RE SUPP
650.0000 mg | RECTAL | Status: DC | PRN
  Filled 2020-06-10: qty 1

## 2020-06-10 MED ORDER — HYDROMORPHONE HCL 1 MG/ML IJ SOLN: 0.2500 mg | INTRAMUSCULAR | Status: DC | PRN

## 2020-06-10 MED ORDER — ONDANSETRON HCL 4 MG/2ML IJ SOLN: 4.0000 mg | Freq: Once | INTRAMUSCULAR | Status: DC | PRN

## 2020-06-10 MED ORDER — ONDANSETRON HCL 4 MG/2ML IJ SOLN
INTRAMUSCULAR | Status: DC | PRN
  Administered 2020-06-10: 4 mg via INTRAVENOUS

## 2020-06-10 MED ORDER — SODIUM CHLORIDE 0.9% FLUSH: 3.0000 mL | INTRAVENOUS | Status: DC | PRN

## 2020-06-10 MED ORDER — FENTANYL CITRATE (PF) 250 MCG/5ML IJ SOLN
INTRAMUSCULAR | Status: AC
  Filled 2020-06-10: qty 5

## 2020-06-10 MED ORDER — BUPIVACAINE HCL 0.25 % IJ SOLN
INTRAMUSCULAR | Status: AC
  Filled 2020-06-10: qty 1

## 2020-06-10 MED ORDER — FENTANYL CITRATE (PF) 250 MCG/5ML IJ SOLN
INTRAMUSCULAR | Status: DC | PRN
  Administered 2020-06-10: 150 ug via INTRAVENOUS
  Administered 2020-06-10: 100 ug via INTRAVENOUS

## 2020-06-10 MED ORDER — 0.9 % SODIUM CHLORIDE (POUR BTL) OPTIME
TOPICAL | Status: DC | PRN
  Administered 2020-06-10: 1000 mL

## 2020-06-10 MED ORDER — ONDANSETRON HCL 4 MG/2ML IJ SOLN
INTRAMUSCULAR | Status: AC
  Filled 2020-06-10: qty 2

## 2020-06-10 MED ORDER — EPHEDRINE 5 MG/ML INJ
INTRAVENOUS | Status: AC
  Filled 2020-06-10: qty 10

## 2020-06-10 MED ORDER — CEFAZOLIN SODIUM-DEXTROSE 2-4 GM/100ML-% IV SOLN
2.0000 g | INTRAVENOUS | Status: AC
  Administered 2020-06-10: 2 g via INTRAVENOUS
  Filled 2020-06-10: qty 100

## 2020-06-10 MED ORDER — ROCURONIUM BROMIDE 10 MG/ML (PF) SYRINGE
PREFILLED_SYRINGE | INTRAVENOUS | Status: AC
  Filled 2020-06-10: qty 10

## 2020-06-10 MED ORDER — MEPERIDINE HCL 50 MG/ML IJ SOLN: 6.2500 mg | INTRAMUSCULAR | Status: DC | PRN

## 2020-06-10 MED ORDER — ACETAMINOPHEN 325 MG PO TABS: 650.0000 mg | ORAL_TABLET | ORAL | Status: DC | PRN

## 2020-06-10 MED ORDER — FENTANYL CITRATE (PF) 100 MCG/2ML IJ SOLN
INTRAMUSCULAR | Status: AC
  Administered 2020-06-10: 25 ug via INTRAVENOUS
  Filled 2020-06-10: qty 2

## 2020-06-10 MED ORDER — ROCURONIUM BROMIDE 10 MG/ML (PF) SYRINGE
PREFILLED_SYRINGE | INTRAVENOUS | Status: DC | PRN
  Administered 2020-06-10: 100 mg via INTRAVENOUS

## 2020-06-10 MED ORDER — BUPIVACAINE LIPOSOME 1.3 % IJ SUSP
INTRAMUSCULAR | Status: DC | PRN
Start: 2020-06-10 — End: 2020-06-10
  Administered 2020-06-10: 20 mL

## 2020-06-10 MED ORDER — INDOCYANINE GREEN 25 MG IV SOLR
INTRAVENOUS | Status: DC | PRN
  Administered 2020-06-10: 6.25 mg via INTRAVENOUS

## 2020-06-10 MED ORDER — LACTATED RINGERS IV SOLN: INTRAVENOUS | Status: DC

## 2020-06-10 MED ORDER — OXYCODONE HCL 5 MG PO TABS: 5.0000 mg | ORAL_TABLET | ORAL | Status: DC | PRN

## 2020-06-10 MED ORDER — GABAPENTIN 300 MG PO CAPS
300.0000 mg | ORAL_CAPSULE | ORAL | Status: AC
  Administered 2020-06-10: 300 mg via ORAL
  Filled 2020-06-10: qty 1

## 2020-06-10 MED ORDER — PROPOFOL 10 MG/ML IV BOLUS
INTRAVENOUS | Status: DC | PRN
  Administered 2020-06-10: 140 mg via INTRAVENOUS

## 2020-06-10 MED ORDER — SODIUM CHLORIDE 0.9 % IV SOLN: 250.0000 mL | INTRAVENOUS | Status: DC | PRN

## 2020-06-10 MED ORDER — MIDAZOLAM HCL 2 MG/2ML IJ SOLN
INTRAMUSCULAR | Status: AC
  Filled 2020-06-10: qty 2

## 2020-06-10 MED ORDER — LIDOCAINE 2% (20 MG/ML) 5 ML SYRINGE
INTRAMUSCULAR | Status: AC
  Filled 2020-06-10: qty 5

## 2020-06-10 MED ORDER — SODIUM CHLORIDE 0.9% FLUSH: 3.0000 mL | Freq: Two times a day (BID) | INTRAVENOUS | Status: DC

## 2020-06-10 SURGICAL SUPPLY — 60 items
APPLIER CLIP 5 13 M/L LIGAMAX5 (MISCELLANEOUS)
APPLIER CLIP ROT 10 11.4 M/L (STAPLE) ×3
BLADE SURG SZ11 CARB STEEL (BLADE) ×3 IMPLANT
CHLORAPREP W/TINT 26 (MISCELLANEOUS) ×6 IMPLANT
CLIP APPLIE 5 13 M/L LIGAMAX5 (MISCELLANEOUS) IMPLANT
CLIP APPLIE ROT 10 11.4 M/L (STAPLE) ×2 IMPLANT
CLIP VESOLOCK LG 6/CT PURPLE (CLIP) ×3 IMPLANT
COVER SURGICAL LIGHT HANDLE (MISCELLANEOUS) ×3 IMPLANT
COVER TIP SHEARS 8 DVNC (MISCELLANEOUS) ×2 IMPLANT
COVER TIP SHEARS 8MM DA VINCI (MISCELLANEOUS) ×3
COVER WAND RF STERILE (DRAPES) ×3 IMPLANT
DECANTER SPIKE VIAL GLASS SM (MISCELLANEOUS) ×6 IMPLANT
DERMABOND ADVANCED (GAUZE/BANDAGES/DRESSINGS) ×1
DERMABOND ADVANCED .7 DNX12 (GAUZE/BANDAGES/DRESSINGS) ×2 IMPLANT
DRAPE ARM DVNC X/XI (DISPOSABLE) ×8 IMPLANT
DRAPE COLUMN DVNC XI (DISPOSABLE) ×2 IMPLANT
DRAPE DA VINCI XI ARM (DISPOSABLE) ×12
DRAPE DA VINCI XI COLUMN (DISPOSABLE) ×3
ELECT PENCIL ROCKER SW 15FT (MISCELLANEOUS) IMPLANT
ELECT REM PT RETURN 15FT ADLT (MISCELLANEOUS) ×6 IMPLANT
GAUZE SPONGE 2X2 8PLY STRL LF (GAUZE/BANDAGES/DRESSINGS) IMPLANT
GLOVE BIO SURGEON STRL SZ 6 (GLOVE) ×9 IMPLANT
GLOVE INDICATOR 6.5 STRL GRN (GLOVE) ×9 IMPLANT
GOWN STRL REUS W/TWL LRG LVL3 (GOWN DISPOSABLE) ×9 IMPLANT
GOWN STRL REUS W/TWL XL LVL3 (GOWN DISPOSABLE) ×6 IMPLANT
GRASPER SUT TROCAR 14GX15 (MISCELLANEOUS) ×3 IMPLANT
HEMOSTAT SNOW SURGICEL 2X4 (HEMOSTASIS) IMPLANT
IRRIGATOR SUCT 8 DISP DVNC XI (IRRIGATION / IRRIGATOR) IMPLANT
IRRIGATOR SUCTION 8MM XI DISP (IRRIGATION / IRRIGATOR)
KIT BASIN OR (CUSTOM PROCEDURE TRAY) ×6 IMPLANT
KIT PROCEDURE DA VINCI SI (MISCELLANEOUS)
KIT PROCEDURE DVNC SI (MISCELLANEOUS) IMPLANT
KIT TURNOVER KIT A (KITS) IMPLANT
MANIFOLD NEPTUNE II (INSTRUMENTS) ×3 IMPLANT
NEEDLE HYPO 22GX1.5 SAFETY (NEEDLE) ×3 IMPLANT
NEEDLE INSUFFLATION 14GA 120MM (NEEDLE) ×6 IMPLANT
PACK CARDIOVASCULAR III (CUSTOM PROCEDURE TRAY) ×3 IMPLANT
PENCIL SMOKE EVACUATOR (MISCELLANEOUS) IMPLANT
SCISSORS LAP 5X35 DISP (ENDOMECHANICALS) ×3 IMPLANT
SEAL CANN UNIV 5-8 DVNC XI (MISCELLANEOUS) ×8 IMPLANT
SEAL XI 5MM-8MM UNIVERSAL (MISCELLANEOUS) ×12
SEALER VESSEL DA VINCI XI (MISCELLANEOUS)
SEALER VESSEL EXT DVNC XI (MISCELLANEOUS) IMPLANT
SET CHOLANGIOGRAPH MIX (MISCELLANEOUS) IMPLANT
SET IRRIG TUBING LAPAROSCOPIC (IRRIGATION / IRRIGATOR) ×3 IMPLANT
SOL ANTI FOG 6CC (MISCELLANEOUS) ×2 IMPLANT
SOLUTION ANTI FOG 6CC (MISCELLANEOUS) ×1
SOLUTION ELECTROLUBE (MISCELLANEOUS) ×3 IMPLANT
SPONGE GAUZE 2X2 STER 10/PKG (GAUZE/BANDAGES/DRESSINGS)
SPONGE LAP 18X18 RF (DISPOSABLE) ×3 IMPLANT
SUT MNCRL AB 4-0 PS2 18 (SUTURE) ×6 IMPLANT
SYR CONTROL 10ML LL (SYRINGE) ×3 IMPLANT
SYS RETRIEVAL 5MM INZII UNIV (BASKET) ×3
SYSTEM RETRIEVL 5MM INZII UNIV (BASKET) ×2 IMPLANT
TOWEL OR 17X26 10 PK STRL BLUE (TOWEL DISPOSABLE) ×6 IMPLANT
TOWEL OR NON WOVEN STRL DISP B (DISPOSABLE) ×3 IMPLANT
TRAY FOLEY MTR SLVR 16FR STAT (SET/KITS/TRAYS/PACK) IMPLANT
TRAY LAPAROSCOPIC (CUSTOM PROCEDURE TRAY) ×3 IMPLANT
TROCAR BLADELESS OPT 5 100 (ENDOMECHANICALS) IMPLANT
TUBING INSUFFLATION 10FT LAP (TUBING) ×3 IMPLANT

## 2020-06-10 NOTE — Transfer of Care (Signed)
Immediate Anesthesia Transfer of Care Note  Patient: Johnny Logan  Procedure(s) Performed: XI ROBOTIC ASSISTED LAPAROSCOPIC CHOLECYSTECTOMY (N/A Abdomen)  Patient Location: PACU  Anesthesia Type:General  Level of Consciousness: awake, drowsy and patient cooperative  Airway & Oxygen Therapy: Patient Spontanous Breathing and Patient connected to face mask oxygen  Post-op Assessment: Report given to RN and Post -op Vital signs reviewed and stable  Post vital signs: Reviewed and stable  Last Vitals:  Vitals Value Taken Time  BP 173/97 06/10/20 1402  Temp    Pulse 66   Resp    SpO2 100   Vitals shown include unvalidated device data.  Last Pain:  Vitals:   06/10/20 1041  TempSrc:   PainSc: 0-No pain         Complications: No complications documented.

## 2020-06-10 NOTE — Anesthesia Procedure Notes (Signed)
Procedure Name: Intubation Date/Time: 06/10/2020 12:38 PM Performed by: Raenette Rover, CRNA Pre-anesthesia Checklist: Patient identified, Emergency Drugs available, Suction available and Patient being monitored Patient Re-evaluated:Patient Re-evaluated prior to induction Oxygen Delivery Method: Circle system utilized Preoxygenation: Pre-oxygenation with 100% oxygen Induction Type: IV induction Ventilation: Mask ventilation without difficulty Laryngoscope Size: Mac and 4 Grade View: Grade I Tube type: Oral Tube size: 7.5 mm Number of attempts: 1 Airway Equipment and Method: Stylet Placement Confirmation: ETT inserted through vocal cords under direct vision,  positive ETCO2 and breath sounds checked- equal and bilateral Secured at: 23 cm Tube secured with: Tape Dental Injury: Teeth and Oropharynx as per pre-operative assessment  Comments: Patient had a sore on his lip that did bleed a scant amount after DL. Pressure held until hemostasis.

## 2020-06-10 NOTE — Interval H&P Note (Signed)
History and Physical Interval Note:  06/10/2020 11:42 AM  Johnny Logan  has presented today for surgery, with the diagnosis of BILIARY COLIC.  The various methods of treatment have been discussed with the patient and family. After consideration of risks, benefits and other options for treatment, the patient has consented to  Procedure(s): LAPAROSCOPIC CHOLECYSTECTOMY (N/A) as a surgical intervention.  The patient's history has been reviewed, patient examined, no change in status, stable for surgery.  I have reviewed the patient's chart and labs.  Questions were answered to the patient's satisfaction.     Kathyjo Briere Rich Brave

## 2020-06-10 NOTE — Discharge Instructions (Signed)
LAPAROSCOPIC SURGERY: POST OP INSTRUCTIONS   EAT Gradually transition to a high fiber diet with a fiber supplement over the next few weeks after discharge.  Start with a pureed / full liquid diet (see below)  WALK Walk an hour a day.  Control your pain to do that.    CONTROL PAIN Control pain so that you can walk, sleep, tolerate sneezing/coughing, go up/down stairs.  HAVE A BOWEL MOVEMENT DAILY Keep your bowels regular to avoid problems.  OK to try a laxative to override constipation.  OK to use an antidairrheal to slow down diarrhea.  Call if not better after 2 tries  CALL IF YOU HAVE PROBLEMS/CONCERNS Call if you are still struggling despite following these instructions. Call if you have concerns not answered by these instructions    1. DIET: Follow a light bland diet & liquids the first 24 hours after arrival home, such as soup, liquids, starches, etc.  Be sure to drink plenty of fluids.  Quickly advance to a usual solid diet within a few days.  Avoid fast food or heavy meals as your are more likely to get nauseated or have irregular bowels.  A low-sugar, high-fiber diet for the rest of your life is ideal.  2. Take your usually prescribed home medications unless otherwise directed.  3. PAIN CONTROL: a. Pain is best controlled by a usual combination of three different methods TOGETHER: i. Ice/Heat ii. Over the counter pain medication iii. Prescription pain medication b. Most patients will experience some swelling and bruising around the incisions.  Ice packs or heating pads (30-60 minutes up to 6 times a day) will help. Use ice for the first few days to help decrease swelling and bruising, then switch to heat to help relax tight/sore spots and speed recovery.  Some people prefer to use ice alone, heat alone, alternating between ice & heat.  Experiment to what works for you.  Swelling and bruising can take several weeks to resolve.   c. It is helpful to take an over-the-counter pain  medication regularly for the first few days: i. Naproxen (Aleve, etc)  Two 220mg  tabs twice a day OR Ibuprofen (Advil, etc) Three 200mg  tabs four times a day (every meal & bedtime) AND ii. Acetaminophen (Tylenol, etc) 500-650mg  four times a day (every meal & bedtime) d. A  prescription for pain medication (such as oxycodone, hydrocodone, tramadol, gabapentin, methocarbamol, etc) should be given to you upon discharge.  Take your pain medication as prescribed, IF NEEDED.  i. If you are having problems/concerns with the prescription medicine (does not control pain, nausea, vomiting, rash, itching, etc), please call us [phone] to see if we need to switch you to a different pain medicine that will work better for you and/or control your side effect better. ii. If you need a refill on your pain medication, please give [place] 48 hour notice.  contact your pharmacy.  They will contact our office to request authorization. Prescriptions will not be filled after 5 pm or on week-ends  4. Avoid getting constipated.   a. Between the surgery and the pain medications, it is common to experience some constipation.   b. Increasing fluid intake and taking a fiber supplement (such as Metamucil, Citrucel, FiberCon, MiraLax, etc) 1-2 times a day regularly will usually help prevent this problem from occurring.   c. A mild laxative (prune juice, Milk of Magnesia, MiraLax, etc) should be taken according to package directions if there are no bowel movements after 48 hours.  5. Watch out for diarrhea.   a. If you have many loose bowel movements, simplify your diet to bland foods & liquids for a few days.   b. Stop any stool softeners and decrease your fiber supplement.   c. Switching to mild anti-diarrheal medications (Kayopectate, Pepto Bismol) can help.   d. If this worsens or does not improve, please call us.  6. Wash / shower every day.  You may shower over the skin glue which is waterproof  7. Glue will flake off  after about 2 weeks.  You may leave the incision open to air.  You may replace a dressing/Band-Aid to cover the incision for comfort if you wish.   8. ACTIVITIES as tolerated:   a. You may resume regular (light) daily activities beginning the next day--such as daily self-care, walking, climbing stairs--gradually increasing activities as tolerated.  If you can walk 30 minutes without difficulty, it is safe to try more intense activity such as jogging, treadmill, bicycling, low-impact aerobics, swimming, etc. b. Save the most intensive and strenuous activity for last such as sit-ups, heavy lifting, contact sports, etc  Refrain from any heavy lifting or straining until you are off narcotics for pain control.   c. DO NOT PUSH THROUGH PAIN.  Let pain be your guide: If it hurts to do something, don't do it.  Pain is your body warning you to avoid that activity for another week until the pain goes down. d. You may drive when you are no longer taking prescription pain medication, you can comfortably wear a seatbelt, and you can safely maneuver your car and apply brakes. e. You may have sexual intercourse when it is comfortable.  9. FOLLOW UP in our office a. Please call [facility] at [phone] to set up an appointment to see your surgeon in the office for a follow-up appointment approximately 2-3 weeks after your surgery. b. Make sure that you call for this appointment the day you arrive home to insure a convenient appointment time.  10. IF YOU HAVE DISABILITY OR FAMILY LEAVE FORMS, BRING THEM TO THE OFFICE FOR PROCESSING.  DO NOT GIVE THEM TO YOUR DOCTOR.   WHEN TO CALL US [phone]: 1. Poor pain control 2. Reactions / problems with new medications (rash/itching, nausea, etc)  3. Fever over 101.5 F (38.5 C) 4. Inability to urinate 5. Nausea and/or vomiting 6. Worsening swelling or bruising 7. Continued bleeding from incision. 8. Increased pain, redness, or drainage from the incision   The  clinic staff is available to answer your questions during regular business hours (8:30am-5pm).  Please don't hesitate to call and ask to speak to one of our nurses for clinical concerns.   If you have a medical emergency, go to the nearest emergency room or call 911.  A surgeon from [facility] is always on call at the hospitals   [facility] [address], [address], [place], [place]  [address] ? MAIN: [phone] ? TOLL FREE: [phone] ?  FAX [phone] [email or web address]   

## 2020-06-10 NOTE — Anesthesia Postprocedure Evaluation (Signed)
Anesthesia Post Note  Patient: Johnny Logan  Procedure(s) Performed: XI ROBOTIC ASSISTED LAPAROSCOPIC CHOLECYSTECTOMY (N/A Abdomen)     Patient location during evaluation: PACU Anesthesia Type: General Level of consciousness: awake and alert Pain management: pain level controlled Vital Signs Assessment: post-procedure vital signs reviewed and stable Respiratory status: spontaneous breathing, nonlabored ventilation, respiratory function stable and patient connected to nasal cannula oxygen Cardiovascular status: blood pressure returned to baseline and stable Postop Assessment: no apparent nausea or vomiting Anesthetic complications: no   No complications documented.  Last Vitals:  Vitals:   06/10/20 1500 06/10/20 1530  BP: (!) 188/85 (!) 182/100  Pulse: (!) 54 (!) 57  Resp: 13 20  Temp:  36.4 C  SpO2: 100% 97%    Last Pain:  Vitals:   06/10/20 1530  TempSrc: Oral  PainSc:                  Tanith Dagostino DAVID

## 2020-06-10 NOTE — Op Note (Signed)
Operative Note  Johnny Logan  [id number]  [id number]  06/10/2020   Surgeon: Vikki Ports A ConnorMD  Procedure performed: Robotic cholecystectomy  Preop diagnosis: Biliary colic Post-op diagnosis/intraop findings: Chronic cholecystitis  Specimens: Gallbladder Retained items: No EBL: Minimal cc Complications: none  Description of procedure: After obtaining informed consent the patient was taken to the operating room and placed supine on operating room table wheregeneral endotracheal anesthesia was initiated, preoperative antibiotics were administered, SCDs applied, and a formal timeout was performed.  Peritoneal access was gained with a Veress needle at Palmer's point and insufflation to 15 mm ensued without incident.  An 8 mm trocar was placed in the periumbilical region followed by the camera and gross inspection demonstrated no injury from our entry or other abnormalities.  The Veress was removed.  Bilateral taps blocks were performed with Exparel mixed with quarter percent Marcaine.  Under direct visualization 3 additional 8 mm trochars were placed.  The robot was then docked and all instruments placed under direct visualization.   The gallbladder was retracted cephalad and the infundibulum retracted laterally.  There is a large amount of fat infiltration along the peritoneum of the gallbladder and hepatocystic triangle.  A combination of hook electrocautery and blunt dissection was utilized to clear the peritoneum from the neck and cystic duct, circumferentially isolating the cystic artery and cystic duct and lifting the gallbladder from the cystic plate. The critical view of safety was achieved with the cystic artery, cystic duct, and liver bed visualized between them with no other structures.  This was confirmed on firefly view.  The artery was clipped with a single clip proximally and distally and divided as was the cystic duct with three clips on the proximal end. The gallbladder was  dissected from the liver plate using electrocautery. Once freed the gallbladder was placed in an endocatch bag and removed through the left upper quadrant trocar site at the end of the case. A small amount of bleeding on the liver bed was controlled with cautery.  Scant bile had been spilled from the gallbladder during its dissection from the liver bed. This was aspirated and the right upper quadrant was irrigated and aspirated; the effluent was clear. Hemostasis was once again confirmed, and reinspection of the abdomen revealed no injuries. The clips were well opposed without any bile leak from the duct or the liver bed either on direct inspection nor with firefly.    The robotic instruments were then removed under direct visualization and the robot undocked.  After removing the gallbladder, the fascia at the extraction site in the left upper quadrant was closed with a 0 vicryl in the fascia under direct visualization using a PMI device. The abdomen was desufflated and all trocars removed. The skin incisions were closed with subcuticular monocryl and Dermabond. The patient was awakened, extubated and transported to the recovery room in stable condition.    All counts were correct at the completion of the case.       All counts were correct at the completion of the case.

## 2020-06-10 NOTE — Anesthesia Preprocedure Evaluation (Signed)
Anesthesia Evaluation  Patient identified by MRN, date of birth, ID band Patient awake    Reviewed: Allergy & Precautions, NPO status , Patient's Chart, lab work & pertinent test results  History of Anesthesia Complications (+) PONV  Airway Mallampati: I  TM Distance: >3 FB Neck ROM: Full    Dental   Pulmonary sleep apnea ,    Pulmonary exam normal        Cardiovascular hypertension, Pt. on medications Normal cardiovascular exam     Neuro/Psych    GI/Hepatic   Endo/Other    Renal/GU      Musculoskeletal   Abdominal   Peds  Hematology   Anesthesia Other Findings   Reproductive/Obstetrics                             Anesthesia Physical Anesthesia Plan  ASA: II  Anesthesia Plan: General   Post-op Pain Management:    Induction: Intravenous  PONV Risk Score and Plan: 3 and Ondansetron, Midazolam and Treatment may vary due to age or medical condition  Airway Management Planned: Oral ETT  Additional Equipment:   Intra-op Plan:   Post-operative Plan: Extubation in OR  Informed Consent: I have reviewed the patients History and Physical, chart, labs and discussed the procedure including the risks, benefits and alternatives for the proposed anesthesia with the patient or authorized representative who has indicated his/her understanding and acceptance.       Plan Discussed with: CRNA and Surgeon  Anesthesia Plan Comments:         Anesthesia Quick Evaluation

## 2020-06-11 LAB — SURGICAL PATHOLOGY

## 2020-08-29 ENCOUNTER — Telehealth: Payer: Self-pay | Admitting: Physician Assistant

## 2020-08-29 ENCOUNTER — Telehealth (HOSPITAL_COMMUNITY): Payer: Self-pay

## 2020-08-29 NOTE — Telephone Encounter (Signed)
Called to discuss with Johnny Logan about Covid symptoms and the use of  monoclonal antibody infusion for those with mild to moderate Covid symptoms and at a high risk of hospitalization.     Pt is qualified for this infusion due to co-morbid conditions and/or a member of an at-risk group, however waiting on result of test.   The CPT code  [phone] for insurance reference given.  He will reach out to me once his test comes back.  Risk factors: HTN, age > 32 and BMI >25  Jeaninne Lodico - PAC

## 2020-08-29 NOTE — Telephone Encounter (Signed)
Called to Discuss with patient about Covid symptoms and the use of the monoclonal antibody infusion for those with mild to moderate Covid symptoms and at a high risk of hospitalization.     Pt appears to qualify for this infusion due to co-morbid conditions and/or a member of an at-risk group in accordance with the FDA Emergency Use Authorization.    Patient interested in treatment.   Pre-screened by RN and ready for APP.   Household exposure.   Sx onset 10/26 ; Test 10/27 - awaiting test results from [place]; having sx; Risk Factor: HTN, age over 68.

## 2020-08-30 ENCOUNTER — Telehealth: Payer: Self-pay | Admitting: Physician Assistant

## 2020-08-30 NOTE — Telephone Encounter (Signed)
Called patient to check on him. He is feeling well. Waiting for result.

## 2020-09-17 ENCOUNTER — Emergency Department (HOSPITAL_COMMUNITY): Payer: Medicare Other

## 2020-09-17 ENCOUNTER — Other Ambulatory Visit: Payer: Self-pay

## 2020-09-17 ENCOUNTER — Emergency Department (HOSPITAL_COMMUNITY)
Admission: EM | Admit: 2020-09-17 | Discharge: 2020-09-17 | Disposition: A | Discharge status: Home / Self Care | Payer: Medicare Other | Attending: Emergency Medicine | Admitting: Emergency Medicine

## 2020-09-17 ENCOUNTER — Encounter (HOSPITAL_COMMUNITY): Payer: Self-pay

## 2020-09-17 DIAGNOSIS — K5792 Diverticulitis of intestine, part unspecified, without perforation or abscess without bleeding: Secondary | ICD-10-CM | POA: Diagnosis not present

## 2020-09-17 DIAGNOSIS — Z8546 Personal history of malignant neoplasm of prostate: Secondary | ICD-10-CM | POA: Insufficient documentation

## 2020-09-17 DIAGNOSIS — Z7982 Long term (current) use of aspirin: Secondary | ICD-10-CM | POA: Insufficient documentation

## 2020-09-17 DIAGNOSIS — Z79899 Other long term (current) drug therapy: Secondary | ICD-10-CM | POA: Diagnosis not present

## 2020-09-17 DIAGNOSIS — I10 Essential (primary) hypertension: Secondary | ICD-10-CM | POA: Diagnosis not present

## 2020-09-17 DIAGNOSIS — R1031 Right lower quadrant pain: Secondary | ICD-10-CM | POA: Diagnosis present

## 2020-09-17 LAB — COMPREHENSIVE METABOLIC PANEL
ALT: 23 U/L (ref 0–44)
AST: 20 U/L (ref 15–41)
Albumin: 4 g/dL (ref 3.5–5.0)
Alkaline Phosphatase: 71 U/L (ref 38–126)
Anion gap: 12 (ref 5–15)
BUN: 15 mg/dL (ref 8–23)
CO2: 19 mmol/L — ABNORMAL LOW (ref 22–32)
Calcium: 8.8 mg/dL — ABNORMAL LOW (ref 8.9–10.3)
Chloride: 102 mmol/L (ref 98–111)
Creatinine, Ser: 1.04 mg/dL (ref 0.61–1.24)
GFR, Estimated: >60 mL/min (ref >60)
Glucose, Bld: 122 mg/dL — ABNORMAL HIGH (ref 70–99)
Potassium: 3.6 mmol/L (ref 3.5–5.1)
Sodium: 133 mmol/L — ABNORMAL LOW (ref 135–145)
Total Bilirubin: 0.9 mg/dL (ref 0.3–1.2)
Total Protein: 7.1 g/dL (ref 6.5–8.1)

## 2020-09-17 LAB — URINALYSIS, ROUTINE W REFLEX MICROSCOPIC
Bacteria, UA: NONE SEEN
Bilirubin Urine: NEGATIVE
Glucose, UA: NEGATIVE mg/dL
Ketones, ur: NEGATIVE mg/dL
Leukocytes,Ua: NEGATIVE
Nitrite: NEGATIVE
Protein, ur: NEGATIVE mg/dL
Specific Gravity, Urine: 1.015 (ref 1.005–1.030)
pH: 7 (ref 5.0–8.0)

## 2020-09-17 LAB — CBC WITH DIFFERENTIAL/PLATELET
Abs Immature Granulocytes: 0.04 10*3/uL (ref 0.00–0.07)
Basophils Absolute: 0 10*3/uL (ref 0.0–0.1)
Basophils Relative: 0 %
Eosinophils Absolute: 0.1 10*3/uL (ref 0.0–0.5)
Eosinophils Relative: 1 %
HCT: 40.6 % (ref 39.0–52.0)
Hemoglobin: 13.5 g/dL (ref 13.0–17.0)
Immature Granulocytes: 0 %
Lymphocytes Relative: 9 %
Lymphs Abs: 1 10*3/uL (ref 0.7–4.0)
MCH: 28.5 pg (ref 26.0–34.0)
MCHC: 33.3 g/dL (ref 30.0–36.0)
MCV: 85.7 fL (ref 80.0–100.0)
Monocytes Absolute: 0.9 10*3/uL (ref 0.1–1.0)
Monocytes Relative: 8 %
Neutro Abs: 9.3 10*3/uL — ABNORMAL HIGH (ref 1.7–7.7)
Neutrophils Relative %: 82 %
Platelets: 146 10*3/uL — ABNORMAL LOW (ref 150–400)
RBC: 4.74 MIL/uL (ref 4.22–5.81)
RDW: 14.1 % (ref 11.5–15.5)
WBC: 11.3 10*3/uL — ABNORMAL HIGH (ref 4.0–10.5)
nRBC: 0 % (ref 0.0–0.2)

## 2020-09-17 LAB — LIPASE, BLOOD: Lipase: 29 U/L (ref 11–51)

## 2020-09-17 MED ORDER — MORPHINE SULFATE (PF) 4 MG/ML IV SOLN
4.0000 mg | Freq: Once | INTRAVENOUS | Status: AC
  Administered 2020-09-17: 4 mg via INTRAVENOUS
  Filled 2020-09-17: qty 1

## 2020-09-17 MED ORDER — ONDANSETRON HCL 4 MG/2ML IJ SOLN
4.0000 mg | Freq: Once | INTRAMUSCULAR | Status: AC
  Administered 2020-09-17: 4 mg via INTRAVENOUS
  Filled 2020-09-17: qty 2

## 2020-09-17 MED ORDER — AMOXICILLIN-POT CLAVULANATE 875-125 MG PO TABS
1.0000 | ORAL_TABLET | Freq: Once | ORAL | Status: AC
  Administered 2020-09-17: 1 via ORAL
  Filled 2020-09-17: qty 1

## 2020-09-17 MED ORDER — AMOXICILLIN-POT CLAVULANATE 875-125 MG PO TABS: 1.0000 | ORAL_TABLET | Freq: Three times a day (TID) | ORAL | 0 refills | Status: AC

## 2020-09-17 MED ORDER — LACTATED RINGERS IV BOLUS
1000.0000 mL | Freq: Once | INTRAVENOUS | Status: AC
  Administered 2020-09-17: 1000 mL via INTRAVENOUS

## 2020-09-17 MED ORDER — ONDANSETRON 4 MG PO TBDP: 4.0000 mg | ORAL_TABLET | Freq: Three times a day (TID) | ORAL | 0 refills | Status: AC | PRN

## 2020-09-17 MED ORDER — IOHEXOL 300 MG/ML  SOLN
100.0000 mL | Freq: Once | INTRAMUSCULAR | Status: AC | PRN
  Administered 2020-09-17: 100 mL via INTRAVENOUS

## 2020-09-17 NOTE — ED Triage Notes (Signed)
Pt complains of right lower quad pain for a few weeks but today developed a high fever and tenderness to touch in that area Pt was seen at the [facility] and they sent him here for further evaluation of his appendix

## 2020-09-17 NOTE — ED Provider Notes (Signed)
[facility] DEPT Provider Note   CSN: [id number] Arrival date & time: 09/17/20  1913     History Chief Complaint  Patient presents with  . Abdominal Pain    Johnny Logan is a 64 y.o. male.   Abdominal Pain Pain location:  RLQ Pain quality: aching   Pain radiates to:  Does not radiate Pain severity:  Severe Onset quality:  Gradual Duration:  1 day Timing:  Constant Progression:  Worsening Relieved by:  Nothing Worsened by:  Nothing Ineffective treatments:  None tried Associated symptoms: anorexia, chills, fever and nausea   Associated symptoms: no chest pain, no cough, no diarrhea, no dysuria, no shortness of breath and no vomiting        Past Medical History:  Diagnosis Date  . Anemia   . Arthritis   . ED (erectile dysfunction)   . Fatty liver   . H/O hiatal hernia   . Hyperlipidemia   . Hypertension   . Incomplete RBBB 06/09/2020  . OSA on CPAP   . Osteoarthritis   . Pneumonia    History of  . PONV (postoperative nausea and vomiting)   . Prostate cancer ([place])   . 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 Acute medial meniscal tear 07/03/2014    Past Surgical History:  Procedure Laterality Date  . COLONOSCOPY    . KNEE ARTHROSCOPY     bilateral   . KNEE ARTHROSCOPY WITH MENISCAL REPAIR Right 07/03/2014   Procedure: RIGHT KNEE ARTHROSCOPY WITH MEDIAL MENISCAL  DEBRIDEMENT, CHONDROPLASTY;  Surgeon: Gearlean Alf, MD;  Location: [facility]S;  Service: Orthopedics;  Laterality: Right;  . PROSTATE SURGERY    . right ankle reconstructive surgery          History reviewed. No pertinent family history.  Social History   Tobacco Use  . Smoking status: Never Smoker  . Smokeless tobacco: Never Used  Vaping Use  . Vaping Use: Never used  Substance Use Topics  . Alcohol use: Yes    Comment: occasional   . Drug use: Never    Home  Medications Prior to Admission medications   Medication Sig Start Date End Date Taking? Authorizing Provider  aspirin EC 81 MG tablet Take 81 mg by mouth daily.   Yes [provider]  EPINEPHrine 0.3 mg/0.3 mL IJ SOAJ injection epinephrine 0.3 mg/0.3 mL injection, auto-injector   Yes [provider]  pravastatin (PRAVACHOL) 40 MG tablet Take 40 mg by mouth daily at 6 PM.   Yes [provider]  PRESCRIPTION MEDICATION Place 1 Device into the nose as directed. CPAP Machine used at night   Yes [provider]  telmisartan (MICARDIS) 40 MG tablet Take 40 mg by mouth daily.    Yes [provider]  amoxicillin-clavulanate (AUGMENTIN) 875-125 MG tablet Take 1 tablet by mouth in the morning, at noon, and at bedtime for 7 days. 09/17/20 09/24/20  Breck Coons, MD  ondansetron (ZOFRAN ODT) 4 MG disintegrating tablet Take 1 tablet (4 mg total) by mouth every 8 (eight) hours as needed for up to 10 doses for nausea or vomiting. 09/17/20   Breck Coons, MD    Allergies    Codeine  Review of Systems   Review of Systems  Constitutional: Positive for chills, diaphoresis and fever.  HENT: Negative for congestion and rhinorrhea.   Respiratory: Negative  for cough and shortness of breath.   Cardiovascular: Negative for chest pain and palpitations.  Gastrointestinal: Positive for abdominal pain, anorexia and nausea. Negative for diarrhea and vomiting.  Genitourinary: Positive for difficulty urinating. Negative for dysuria.  Musculoskeletal: Negative for arthralgias and back pain.  Skin: Negative for color change and rash.  Neurological: Negative for light-headedness and headaches.    Physical Exam Updated Vital Signs BP (!) 141/71 (BP Location: Right Arm)   Pulse 68   Temp 100.2 F (37.9 C) (Oral)   Resp 16   Ht 5\' 10"  (1.778 m)   Wt 95.3 kg   SpO2 100%   BMI 30.13 kg/m   Physical Exam Vitals and nursing note reviewed.  Constitutional:      General:  He is not in acute distress.    Appearance: Normal appearance.  HENT:     Head: Normocephalic and atraumatic.     Nose: No rhinorrhea.  Eyes:     General:        Right eye: No discharge.        Left eye: No discharge.     Conjunctiva/sclera: Conjunctivae normal.  Cardiovascular:     Rate and Rhythm: Normal rate and regular rhythm.  Pulmonary:     Effort: Pulmonary effort is normal.     Breath sounds: No stridor.  Abdominal:     General: Abdomen is flat. There is no distension.     Palpations: Abdomen is soft.     Tenderness: There is abdominal tenderness in the right lower quadrant. There is guarding and rebound. Positive signs include Rovsing's sign.  Genitourinary:    Penis: Normal.      Testes: Normal. Cremasteric reflex is present.        Right: Tenderness not present.        Left: Tenderness not present.  Musculoskeletal:        General: No deformity or signs of injury.  Skin:    General: Skin is warm and dry.  Neurological:     General: No focal deficit present.     Mental Status: He is alert. Mental status is at baseline.     Motor: No weakness.  Psychiatric:        Mood and Affect: Mood normal.        Behavior: Behavior normal.        Thought Content: Thought content normal.     ED Results / Procedures / Treatments   Labs (all labs ordered are listed, but only abnormal results are displayed) Labs Reviewed  CBC WITH DIFFERENTIAL/PLATELET - Abnormal; Notable for the following components:      Result Value   WBC 11.3 (*)    Platelets 146 (*)    Neutro Abs 9.3 (*)    All other components within normal limits  COMPREHENSIVE METABOLIC PANEL - Abnormal; Notable for the following components:   Sodium 133 (*)    CO2 19 (*)    Glucose, Bld 122 (*)    Calcium 8.8 (*)    All other components within normal limits  LIPASE, BLOOD  URINALYSIS, ROUTINE W REFLEX MICROSCOPIC    EKG None  Radiology CT ABDOMEN PELVIS W CONTRAST  Result Date: 09/17/2020 CLINICAL  DATA:  Right lower quadrant pain EXAM: CT ABDOMEN AND PELVIS WITH CONTRAST TECHNIQUE: Multidetector CT imaging of the abdomen and pelvis was performed using the standard protocol following bolus administration of intravenous contrast. CONTRAST:  178mL OMNIPAQUE IOHEXOL 300 MG/ML  SOLN COMPARISON:  03/24/2020 FINDINGS: Lower  chest: Lung bases are clear. No effusions. Heart is normal size. Hepatobiliary: Small cyst in the right hepatic lobe is stable since prior study. Prior cholecystectomy. No suspicious focal hepatic abnormality. Pancreas: No focal abnormality or ductal dilatation. Spleen: No focal abnormality.  Normal size. Adrenals/Urinary Tract: No adrenal abnormality. No focal renal abnormality. No stones or hydronephrosis. Urinary bladder is unremarkable. Stomach/Bowel: Sigmoid diverticulosis. Wall thickening and inflammatory stranding around the proximal and mid sigmoid colon compatible with active diverticulitis. Moderate stool burden in the colon. Normal appendix. Stomach and small bowel decompressed, unremarkable. Vascular/Lymphatic: Aortic atherosclerosis. No evidence of aneurysm or adenopathy. Reproductive: Prostate enlargement. Other: No free fluid or free air. Musculoskeletal: No acute bony abnormality. IMPRESSION: Sigmoid diverticulosis with changes of active diverticulitis. Aortic atherosclerosis. Moderate stool burden in the colon. Electronically Signed   By: Rolm Baptise M.D.   On: 09/17/2020 21:27    Procedures Procedures (including critical care time)  Medications Ordered in ED Medications  amoxicillin-clavulanate (AUGMENTIN) 875-125 MG per tablet 1 tablet (has no administration in time range)  morphine 4 MG/ML injection 4 mg (4 mg Intravenous Given 09/17/20 2133)  ondansetron (ZOFRAN) injection 4 mg (4 mg Intravenous Given 09/17/20 2133)  lactated ringers bolus 1,000 mL (1,000 mLs Intravenous New Bag/Given 09/17/20 2131)  iohexol (OMNIPAQUE) 300 MG/ML solution 100 mL (100 mLs  Intravenous Contrast Given 09/17/20 2108)    ED Course  I have reviewed the triage vital signs and the nursing notes.  Pertinent labs & imaging results that were available during my care of the patient were reviewed by me and considered in my medical decision making (see chart for details).    MDM Rules/Calculators/A&P                          Sudden onset severe right lower quadrant pain with fevers.  Peritoneal signs on exam.  Will get CT imaging and lab studies.  Pain control given IV antiemetics IV fluids given.  CT imaging consistent with acute diverticulitis no complications, patient only having mild fevers.  Tolerating p.o.  No significant leukocytosis.  Wants to try outpatient therapy, Augmentin prescribed due to age.  Zofran provided as well.  Strict return precautions given vital signs stable time discharge  Final Clinical Impression(s) / ED Diagnoses Final diagnoses:  Diverticulitis    Rx / DC Orders ED Discharge Orders         Ordered    amoxicillin-clavulanate (AUGMENTIN) 875-125 MG tablet  3 times daily        09/17/20 2151    ondansetron (ZOFRAN ODT) 4 MG disintegrating tablet  Every 8 hours PRN        09/17/20 2151           Breck Coons, MD 09/17/20 2153

## 2020-09-18 ENCOUNTER — Telehealth: Payer: Self-pay | Admitting: *Deleted

## 2020-09-18 NOTE — Telephone Encounter (Signed)
Pharmacy called related to Rx: Augmentin dosing and Zofran not being covered by pt insurance without prior auth .Marland KitchenMarland KitchenEDCM clarified with EDP ([place]) to fill Augmentin as written and change Zofran to Reglan 10mg  with same directions.

## 2020-11-03 DIAGNOSIS — M1711 Unilateral primary osteoarthritis, right knee: Secondary | ICD-10-CM | POA: Diagnosis not present

## 2020-11-05 DIAGNOSIS — M1711 Unilateral primary osteoarthritis, right knee: Secondary | ICD-10-CM | POA: Diagnosis not present

## 2020-11-07 DIAGNOSIS — M1711 Unilateral primary osteoarthritis, right knee: Secondary | ICD-10-CM | POA: Diagnosis not present

## 2020-11-10 DIAGNOSIS — M1711 Unilateral primary osteoarthritis, right knee: Secondary | ICD-10-CM | POA: Diagnosis not present

## 2020-11-12 DIAGNOSIS — M1711 Unilateral primary osteoarthritis, right knee: Secondary | ICD-10-CM | POA: Diagnosis not present

## 2020-11-14 DIAGNOSIS — M1711 Unilateral primary osteoarthritis, right knee: Secondary | ICD-10-CM | POA: Diagnosis not present

## 2020-11-18 DIAGNOSIS — M1711 Unilateral primary osteoarthritis, right knee: Secondary | ICD-10-CM | POA: Diagnosis not present

## 2020-11-19 DIAGNOSIS — M1711 Unilateral primary osteoarthritis, right knee: Secondary | ICD-10-CM | POA: Diagnosis not present

## 2020-11-21 DIAGNOSIS — M1711 Unilateral primary osteoarthritis, right knee: Secondary | ICD-10-CM | POA: Diagnosis not present

## 2020-11-24 DIAGNOSIS — M1711 Unilateral primary osteoarthritis, right knee: Secondary | ICD-10-CM | POA: Diagnosis not present

## 2020-11-25 DIAGNOSIS — Z96651 Presence of right artificial knee joint: Secondary | ICD-10-CM | POA: Diagnosis not present

## 2020-11-27 DIAGNOSIS — M1711 Unilateral primary osteoarthritis, right knee: Secondary | ICD-10-CM | POA: Diagnosis not present

## 2020-12-01 DIAGNOSIS — M1711 Unilateral primary osteoarthritis, right knee: Secondary | ICD-10-CM | POA: Diagnosis not present

## 2020-12-04 DIAGNOSIS — M1711 Unilateral primary osteoarthritis, right knee: Secondary | ICD-10-CM | POA: Diagnosis not present

## 2020-12-15 DIAGNOSIS — G479 Sleep disorder, unspecified: Secondary | ICD-10-CM | POA: Diagnosis not present

## 2021-02-04 DIAGNOSIS — G4733 Obstructive sleep apnea (adult) (pediatric): Secondary | ICD-10-CM | POA: Diagnosis not present

## 2021-02-27 DIAGNOSIS — L570 Actinic keratosis: Secondary | ICD-10-CM | POA: Diagnosis not present

## 2021-02-27 DIAGNOSIS — L821 Other seborrheic keratosis: Secondary | ICD-10-CM | POA: Diagnosis not present

## 2021-02-27 DIAGNOSIS — L813 Cafe au lait spots: Secondary | ICD-10-CM | POA: Diagnosis not present

## 2021-02-27 DIAGNOSIS — Z85828 Personal history of other malignant neoplasm of skin: Secondary | ICD-10-CM | POA: Diagnosis not present

## 2021-02-27 DIAGNOSIS — D225 Melanocytic nevi of trunk: Secondary | ICD-10-CM | POA: Diagnosis not present

## 2021-03-02 DIAGNOSIS — H6123 Impacted cerumen, bilateral: Secondary | ICD-10-CM | POA: Diagnosis not present

## 2021-03-12 DIAGNOSIS — H66001 Acute suppurative otitis media without spontaneous rupture of ear drum, right ear: Secondary | ICD-10-CM | POA: Diagnosis not present

## 2021-04-10 DIAGNOSIS — L570 Actinic keratosis: Secondary | ICD-10-CM | POA: Diagnosis not present

## 2021-05-05 DIAGNOSIS — Z0000 Encounter for general adult medical examination without abnormal findings: Secondary | ICD-10-CM | POA: Diagnosis not present

## 2021-05-05 DIAGNOSIS — Z23 Encounter for immunization: Secondary | ICD-10-CM | POA: Diagnosis not present

## 2021-05-05 DIAGNOSIS — D473 Essential (hemorrhagic) thrombocythemia: Secondary | ICD-10-CM | POA: Diagnosis not present

## 2021-05-05 DIAGNOSIS — Z91038 Other insect allergy status: Secondary | ICD-10-CM | POA: Diagnosis not present

## 2021-05-05 DIAGNOSIS — I10 Essential (primary) hypertension: Secondary | ICD-10-CM | POA: Diagnosis not present

## 2021-05-05 DIAGNOSIS — H6690 Otitis media, unspecified, unspecified ear: Secondary | ICD-10-CM | POA: Diagnosis not present

## 2021-05-05 DIAGNOSIS — G4733 Obstructive sleep apnea (adult) (pediatric): Secondary | ICD-10-CM | POA: Diagnosis not present

## 2021-05-05 DIAGNOSIS — Z1389 Encounter for screening for other disorder: Secondary | ICD-10-CM | POA: Diagnosis not present

## 2021-05-05 DIAGNOSIS — E7800 Pure hypercholesterolemia, unspecified: Secondary | ICD-10-CM | POA: Diagnosis not present

## 2021-05-05 DIAGNOSIS — I700 Atherosclerosis of aorta: Secondary | ICD-10-CM | POA: Diagnosis not present

## 2021-06-24 DIAGNOSIS — G4733 Obstructive sleep apnea (adult) (pediatric): Secondary | ICD-10-CM | POA: Diagnosis not present

## 2021-08-25 ENCOUNTER — Other Ambulatory Visit: Payer: Self-pay | Admitting: Otolaryngology

## 2021-08-25 DIAGNOSIS — H6123 Impacted cerumen, bilateral: Secondary | ICD-10-CM | POA: Diagnosis not present

## 2021-08-25 DIAGNOSIS — H918X9 Other specified hearing loss, unspecified ear: Secondary | ICD-10-CM

## 2021-08-25 DIAGNOSIS — H903 Sensorineural hearing loss, bilateral: Secondary | ICD-10-CM | POA: Diagnosis not present

## 2021-08-31 ENCOUNTER — Other Ambulatory Visit: Payer: Self-pay

## 2021-08-31 ENCOUNTER — Ambulatory Visit
Admission: RE | Admit: 2021-08-31 | Discharge: 2021-08-31 | Disposition: A | Discharge status: Home / Self Care | Payer: Medicare Other | Source: Ambulatory Visit | Attending: Otolaryngology | Admitting: Otolaryngology

## 2021-08-31 DIAGNOSIS — H918X2 Other specified hearing loss, left ear: Secondary | ICD-10-CM | POA: Diagnosis not present

## 2021-08-31 DIAGNOSIS — H918X9 Other specified hearing loss, unspecified ear: Secondary | ICD-10-CM

## 2021-08-31 MED ORDER — GADOBENATE DIMEGLUMINE 529 MG/ML IV SOLN
19.0000 mL | Freq: Once | INTRAVENOUS | Status: AC | PRN
  Administered 2021-08-31: 19 mL via INTRAVENOUS

## 2021-10-15 DIAGNOSIS — G4733 Obstructive sleep apnea (adult) (pediatric): Secondary | ICD-10-CM | POA: Diagnosis not present

## 2021-10-19 DIAGNOSIS — Z96651 Presence of right artificial knee joint: Secondary | ICD-10-CM | POA: Diagnosis not present

## 2021-11-06 DIAGNOSIS — M65311 Trigger thumb, right thumb: Secondary | ICD-10-CM | POA: Diagnosis not present

## 2021-11-15 DIAGNOSIS — G4733 Obstructive sleep apnea (adult) (pediatric): Secondary | ICD-10-CM | POA: Diagnosis not present

## 2021-12-07 DIAGNOSIS — Z96651 Presence of right artificial knee joint: Secondary | ICD-10-CM | POA: Diagnosis not present

## 2021-12-07 DIAGNOSIS — M25562 Pain in left knee: Secondary | ICD-10-CM | POA: Diagnosis not present

## 2021-12-16 DIAGNOSIS — G4733 Obstructive sleep apnea (adult) (pediatric): Secondary | ICD-10-CM | POA: Diagnosis not present

## 2021-12-21 DIAGNOSIS — M25562 Pain in left knee: Secondary | ICD-10-CM | POA: Diagnosis not present

## 2021-12-30 DIAGNOSIS — M25562 Pain in left knee: Secondary | ICD-10-CM | POA: Diagnosis not present

## 2022-01-04 DIAGNOSIS — M25562 Pain in left knee: Secondary | ICD-10-CM | POA: Diagnosis not present

## 2022-01-13 DIAGNOSIS — M25562 Pain in left knee: Secondary | ICD-10-CM | POA: Diagnosis not present

## 2022-01-13 DIAGNOSIS — G4733 Obstructive sleep apnea (adult) (pediatric): Secondary | ICD-10-CM | POA: Diagnosis not present

## 2022-01-18 DIAGNOSIS — M25562 Pain in left knee: Secondary | ICD-10-CM | POA: Diagnosis not present

## 2022-02-13 DIAGNOSIS — G4733 Obstructive sleep apnea (adult) (pediatric): Secondary | ICD-10-CM | POA: Diagnosis not present

## 2022-02-25 DIAGNOSIS — N320 Bladder-neck obstruction: Secondary | ICD-10-CM | POA: Diagnosis not present

## 2022-03-15 DIAGNOSIS — G4733 Obstructive sleep apnea (adult) (pediatric): Secondary | ICD-10-CM | POA: Diagnosis not present

## 2022-03-19 DIAGNOSIS — H6123 Impacted cerumen, bilateral: Secondary | ICD-10-CM | POA: Diagnosis not present

## 2022-03-19 DIAGNOSIS — H9042 Sensorineural hearing loss, unilateral, left ear, with unrestricted hearing on the contralateral side: Secondary | ICD-10-CM | POA: Diagnosis not present

## 2022-03-26 DIAGNOSIS — L570 Actinic keratosis: Secondary | ICD-10-CM | POA: Diagnosis not present

## 2022-03-26 DIAGNOSIS — L578 Other skin changes due to chronic exposure to nonionizing radiation: Secondary | ICD-10-CM | POA: Diagnosis not present

## 2022-03-26 DIAGNOSIS — L813 Cafe au lait spots: Secondary | ICD-10-CM | POA: Diagnosis not present

## 2022-03-26 DIAGNOSIS — D225 Melanocytic nevi of trunk: Secondary | ICD-10-CM | POA: Diagnosis not present

## 2022-03-26 DIAGNOSIS — L821 Other seborrheic keratosis: Secondary | ICD-10-CM | POA: Diagnosis not present

## 2022-04-15 DIAGNOSIS — G4733 Obstructive sleep apnea (adult) (pediatric): Secondary | ICD-10-CM | POA: Diagnosis not present

## 2022-05-03 DIAGNOSIS — M65311 Trigger thumb, right thumb: Secondary | ICD-10-CM | POA: Diagnosis not present

## 2022-05-11 DIAGNOSIS — G4733 Obstructive sleep apnea (adult) (pediatric): Secondary | ICD-10-CM | POA: Diagnosis not present

## 2022-05-14 DIAGNOSIS — L821 Other seborrheic keratosis: Secondary | ICD-10-CM | POA: Diagnosis not present

## 2022-05-15 DIAGNOSIS — G4733 Obstructive sleep apnea (adult) (pediatric): Secondary | ICD-10-CM | POA: Diagnosis not present

## 2022-06-15 DIAGNOSIS — G4733 Obstructive sleep apnea (adult) (pediatric): Secondary | ICD-10-CM | POA: Diagnosis not present

## 2022-06-17 DIAGNOSIS — I10 Essential (primary) hypertension: Secondary | ICD-10-CM | POA: Diagnosis not present

## 2022-06-17 DIAGNOSIS — D473 Essential (hemorrhagic) thrombocythemia: Secondary | ICD-10-CM | POA: Diagnosis not present

## 2022-06-17 DIAGNOSIS — H6690 Otitis media, unspecified, unspecified ear: Secondary | ICD-10-CM | POA: Diagnosis not present

## 2022-06-17 DIAGNOSIS — E7800 Pure hypercholesterolemia, unspecified: Secondary | ICD-10-CM | POA: Diagnosis not present

## 2022-06-17 DIAGNOSIS — Z0000 Encounter for general adult medical examination without abnormal findings: Secondary | ICD-10-CM | POA: Diagnosis not present

## 2022-06-17 DIAGNOSIS — G4733 Obstructive sleep apnea (adult) (pediatric): Secondary | ICD-10-CM | POA: Diagnosis not present

## 2022-06-17 DIAGNOSIS — I700 Atherosclerosis of aorta: Secondary | ICD-10-CM | POA: Diagnosis not present

## 2022-06-17 DIAGNOSIS — Z91038 Other insect allergy status: Secondary | ICD-10-CM | POA: Diagnosis not present

## 2022-07-12 DIAGNOSIS — G4733 Obstructive sleep apnea (adult) (pediatric): Secondary | ICD-10-CM | POA: Diagnosis not present

## 2022-07-16 DIAGNOSIS — G4733 Obstructive sleep apnea (adult) (pediatric): Secondary | ICD-10-CM | POA: Diagnosis not present

## 2022-09-22 DIAGNOSIS — H903 Sensorineural hearing loss, bilateral: Secondary | ICD-10-CM | POA: Diagnosis not present

## 2022-09-22 DIAGNOSIS — H6123 Impacted cerumen, bilateral: Secondary | ICD-10-CM | POA: Diagnosis not present

## 2022-10-08 DIAGNOSIS — L570 Actinic keratosis: Secondary | ICD-10-CM | POA: Diagnosis not present

## 2022-10-15 DIAGNOSIS — L820 Inflamed seborrheic keratosis: Secondary | ICD-10-CM | POA: Diagnosis not present

## 2022-10-15 DIAGNOSIS — L570 Actinic keratosis: Secondary | ICD-10-CM | POA: Diagnosis not present

## 2022-11-08 DIAGNOSIS — L570 Actinic keratosis: Secondary | ICD-10-CM | POA: Diagnosis not present

## 2022-11-22 DIAGNOSIS — M545 Low back pain, unspecified: Secondary | ICD-10-CM | POA: Diagnosis not present

## 2022-12-10 DIAGNOSIS — L570 Actinic keratosis: Secondary | ICD-10-CM | POA: Diagnosis not present

## 2022-12-29 DIAGNOSIS — K648 Other hemorrhoids: Secondary | ICD-10-CM | POA: Diagnosis not present

## 2022-12-29 DIAGNOSIS — Z86010 Personal history of colonic polyps: Secondary | ICD-10-CM | POA: Diagnosis not present

## 2022-12-29 DIAGNOSIS — Z09 Encounter for follow-up examination after completed treatment for conditions other than malignant neoplasm: Secondary | ICD-10-CM | POA: Diagnosis not present

## 2022-12-29 DIAGNOSIS — Z800 Family history of malignant neoplasm of digestive organs: Secondary | ICD-10-CM | POA: Diagnosis not present

## 2022-12-29 DIAGNOSIS — K5730 Diverticulosis of large intestine without perforation or abscess without bleeding: Secondary | ICD-10-CM | POA: Diagnosis not present

## 2023-01-03 DIAGNOSIS — R42 Dizziness and giddiness: Secondary | ICD-10-CM | POA: Diagnosis not present

## 2023-01-03 DIAGNOSIS — I10 Essential (primary) hypertension: Secondary | ICD-10-CM | POA: Diagnosis not present

## 2023-01-03 DIAGNOSIS — H6121 Impacted cerumen, right ear: Secondary | ICD-10-CM | POA: Diagnosis not present

## 2023-01-19 DIAGNOSIS — I10 Essential (primary) hypertension: Secondary | ICD-10-CM | POA: Diagnosis not present

## 2023-01-19 DIAGNOSIS — G4733 Obstructive sleep apnea (adult) (pediatric): Secondary | ICD-10-CM | POA: Diagnosis not present

## 2023-02-02 DIAGNOSIS — G4733 Obstructive sleep apnea (adult) (pediatric): Secondary | ICD-10-CM | POA: Diagnosis not present

## 2023-02-11 DIAGNOSIS — H6123 Impacted cerumen, bilateral: Secondary | ICD-10-CM | POA: Diagnosis not present

## 2023-02-11 DIAGNOSIS — H93292 Other abnormal auditory perceptions, left ear: Secondary | ICD-10-CM | POA: Diagnosis not present

## 2023-02-11 DIAGNOSIS — R42 Dizziness and giddiness: Secondary | ICD-10-CM | POA: Diagnosis not present

## 2023-02-14 DIAGNOSIS — H903 Sensorineural hearing loss, bilateral: Secondary | ICD-10-CM | POA: Diagnosis not present

## 2023-02-14 DIAGNOSIS — R42 Dizziness and giddiness: Secondary | ICD-10-CM | POA: Diagnosis not present

## 2023-03-29 ENCOUNTER — Other Ambulatory Visit: Payer: Self-pay | Admitting: Family Medicine

## 2023-03-29 DIAGNOSIS — R229 Localized swelling, mass and lump, unspecified: Secondary | ICD-10-CM | POA: Diagnosis not present

## 2023-03-29 DIAGNOSIS — L989 Disorder of the skin and subcutaneous tissue, unspecified: Secondary | ICD-10-CM

## 2023-03-31 ENCOUNTER — Ambulatory Visit
Admission: RE | Admit: 2023-03-31 | Discharge: 2023-03-31 | Disposition: A | Discharge status: Home / Self Care | Payer: Medicare Other | Source: Ambulatory Visit | Attending: Family Medicine | Admitting: Family Medicine

## 2023-03-31 DIAGNOSIS — L989 Disorder of the skin and subcutaneous tissue, unspecified: Secondary | ICD-10-CM

## 2023-03-31 DIAGNOSIS — R2241 Localized swelling, mass and lump, right lower limb: Secondary | ICD-10-CM | POA: Diagnosis not present

## 2023-04-01 DIAGNOSIS — L821 Other seborrheic keratosis: Secondary | ICD-10-CM | POA: Diagnosis not present

## 2023-04-01 DIAGNOSIS — D225 Melanocytic nevi of trunk: Secondary | ICD-10-CM | POA: Diagnosis not present

## 2023-04-01 DIAGNOSIS — L813 Cafe au lait spots: Secondary | ICD-10-CM | POA: Diagnosis not present

## 2023-04-01 DIAGNOSIS — L820 Inflamed seborrheic keratosis: Secondary | ICD-10-CM | POA: Diagnosis not present

## 2023-04-01 DIAGNOSIS — Z85828 Personal history of other malignant neoplasm of skin: Secondary | ICD-10-CM | POA: Diagnosis not present

## 2023-04-01 DIAGNOSIS — L570 Actinic keratosis: Secondary | ICD-10-CM | POA: Diagnosis not present

## 2023-04-06 ENCOUNTER — Other Ambulatory Visit: Payer: Self-pay | Admitting: Family Medicine

## 2023-04-06 DIAGNOSIS — R2241 Localized swelling, mass and lump, right lower limb: Secondary | ICD-10-CM

## 2023-05-06 ENCOUNTER — Ambulatory Visit
Admission: RE | Admit: 2023-05-06 | Discharge: 2023-05-06 | Disposition: A | Discharge status: Home / Self Care | Payer: Medicare Other | Source: Ambulatory Visit | Attending: Family Medicine | Admitting: Family Medicine

## 2023-05-06 DIAGNOSIS — R2241 Localized swelling, mass and lump, right lower limb: Secondary | ICD-10-CM

## 2023-05-06 DIAGNOSIS — Z96651 Presence of right artificial knee joint: Secondary | ICD-10-CM | POA: Diagnosis not present

## 2023-05-06 MED ORDER — GADOPICLENOL 0.5 MMOL/ML IV SOLN
10.0000 mL | Freq: Once | INTRAVENOUS | Status: AC | PRN
  Administered 2023-05-06: 10 mL via INTRAVENOUS

## 2023-05-23 DIAGNOSIS — S86111A Strain of other muscle(s) and tendon(s) of posterior muscle group at lower leg level, right leg, initial encounter: Secondary | ICD-10-CM | POA: Diagnosis not present

## 2023-08-29 DIAGNOSIS — H903 Sensorineural hearing loss, bilateral: Secondary | ICD-10-CM | POA: Diagnosis not present

## 2023-08-29 DIAGNOSIS — H6123 Impacted cerumen, bilateral: Secondary | ICD-10-CM | POA: Diagnosis not present

## 2023-08-29 DIAGNOSIS — H6991 Unspecified Eustachian tube disorder, right ear: Secondary | ICD-10-CM | POA: Diagnosis not present

## 2023-09-05 DIAGNOSIS — L570 Actinic keratosis: Secondary | ICD-10-CM | POA: Diagnosis not present

## 2023-09-05 DIAGNOSIS — L821 Other seborrheic keratosis: Secondary | ICD-10-CM | POA: Diagnosis not present

## 2023-09-15 DIAGNOSIS — H903 Sensorineural hearing loss, bilateral: Secondary | ICD-10-CM | POA: Diagnosis not present

## 2023-09-15 DIAGNOSIS — H6991 Unspecified Eustachian tube disorder, right ear: Secondary | ICD-10-CM | POA: Diagnosis not present

## 2023-09-21 DIAGNOSIS — I700 Atherosclerosis of aorta: Secondary | ICD-10-CM | POA: Diagnosis not present

## 2023-09-21 DIAGNOSIS — H6123 Impacted cerumen, bilateral: Secondary | ICD-10-CM | POA: Diagnosis not present

## 2023-09-21 DIAGNOSIS — E7800 Pure hypercholesterolemia, unspecified: Secondary | ICD-10-CM | POA: Diagnosis not present

## 2023-09-21 DIAGNOSIS — Z91038 Other insect allergy status: Secondary | ICD-10-CM | POA: Diagnosis not present

## 2023-09-21 DIAGNOSIS — R42 Dizziness and giddiness: Secondary | ICD-10-CM | POA: Diagnosis not present

## 2023-09-21 DIAGNOSIS — Z9989 Dependence on other enabling machines and devices: Secondary | ICD-10-CM | POA: Diagnosis not present

## 2023-09-21 DIAGNOSIS — Z23 Encounter for immunization: Secondary | ICD-10-CM | POA: Diagnosis not present

## 2023-09-21 DIAGNOSIS — Z0000 Encounter for general adult medical examination without abnormal findings: Secondary | ICD-10-CM | POA: Diagnosis not present

## 2023-09-21 DIAGNOSIS — I10 Essential (primary) hypertension: Secondary | ICD-10-CM | POA: Diagnosis not present

## 2023-11-10 DIAGNOSIS — R030 Elevated blood-pressure reading, without diagnosis of hypertension: Secondary | ICD-10-CM | POA: Diagnosis not present

## 2023-11-10 DIAGNOSIS — J029 Acute pharyngitis, unspecified: Secondary | ICD-10-CM | POA: Diagnosis not present

## 2023-11-10 DIAGNOSIS — Z03818 Encounter for observation for suspected exposure to other biological agents ruled out: Secondary | ICD-10-CM | POA: Diagnosis not present

## 2023-11-10 DIAGNOSIS — J069 Acute upper respiratory infection, unspecified: Secondary | ICD-10-CM | POA: Diagnosis not present

## 2023-12-30 DIAGNOSIS — H903 Sensorineural hearing loss, bilateral: Secondary | ICD-10-CM | POA: Diagnosis not present

## 2023-12-30 DIAGNOSIS — H6123 Impacted cerumen, bilateral: Secondary | ICD-10-CM | POA: Diagnosis not present

## 2024-01-13 DIAGNOSIS — H2513 Age-related nuclear cataract, bilateral: Secondary | ICD-10-CM | POA: Diagnosis not present

## 2024-01-25 DIAGNOSIS — G4733 Obstructive sleep apnea (adult) (pediatric): Secondary | ICD-10-CM | POA: Diagnosis not present

## 2024-03-23 DIAGNOSIS — Z91038 Other insect allergy status: Secondary | ICD-10-CM | POA: Diagnosis not present

## 2024-03-23 DIAGNOSIS — E7800 Pure hypercholesterolemia, unspecified: Secondary | ICD-10-CM | POA: Diagnosis not present

## 2024-03-23 DIAGNOSIS — H6123 Impacted cerumen, bilateral: Secondary | ICD-10-CM | POA: Diagnosis not present

## 2024-03-23 DIAGNOSIS — I10 Essential (primary) hypertension: Secondary | ICD-10-CM | POA: Diagnosis not present

## 2024-03-29 ENCOUNTER — Encounter (HOSPITAL_BASED_OUTPATIENT_CLINIC_OR_DEPARTMENT_OTHER): Payer: Self-pay | Admitting: Emergency Medicine

## 2024-03-29 ENCOUNTER — Emergency Department (HOSPITAL_BASED_OUTPATIENT_CLINIC_OR_DEPARTMENT_OTHER)

## 2024-03-29 ENCOUNTER — Emergency Department (HOSPITAL_BASED_OUTPATIENT_CLINIC_OR_DEPARTMENT_OTHER)
Admission: EM | Admit: 2024-03-29 | Discharge: 2024-03-29 | Disposition: A | Discharge status: Home / Self Care | Attending: Emergency Medicine | Admitting: Emergency Medicine

## 2024-03-29 ENCOUNTER — Other Ambulatory Visit: Payer: Self-pay

## 2024-03-29 DIAGNOSIS — W290XXA Contact with powered kitchen appliance, initial encounter: Secondary | ICD-10-CM | POA: Insufficient documentation

## 2024-03-29 DIAGNOSIS — M7989 Other specified soft tissue disorders: Secondary | ICD-10-CM | POA: Insufficient documentation

## 2024-03-29 DIAGNOSIS — S61211A Laceration without foreign body of left index finger without damage to nail, initial encounter: Secondary | ICD-10-CM | POA: Insufficient documentation

## 2024-03-29 DIAGNOSIS — Z23 Encounter for immunization: Secondary | ICD-10-CM | POA: Diagnosis not present

## 2024-03-29 DIAGNOSIS — Z7982 Long term (current) use of aspirin: Secondary | ICD-10-CM | POA: Insufficient documentation

## 2024-03-29 DIAGNOSIS — S6992XA Unspecified injury of left wrist, hand and finger(s), initial encounter: Secondary | ICD-10-CM | POA: Diagnosis present

## 2024-03-29 MED ORDER — LIDOCAINE-EPINEPHRINE (PF) 2 %-1:200000 IJ SOLN
10.0000 mL | Freq: Once | INTRAMUSCULAR | Status: AC
  Administered 2024-03-29: 10 mL
  Filled 2024-03-29: qty 20

## 2024-03-29 MED ORDER — TETANUS-DIPHTH-ACELL PERTUSSIS 5-2.5-18.5 LF-MCG/0.5 IM SUSY
0.5000 mL | PREFILLED_SYRINGE | Freq: Once | INTRAMUSCULAR | Status: AC
  Administered 2024-03-29: 0.5 mL via INTRAMUSCULAR
  Filled 2024-03-29: qty 0.5

## 2024-03-29 NOTE — Discharge Instructions (Addendum)
 Keep wound dry. Do NOT apply antibiotic ointment, this will take the glue off.   Your x-ray has not been read by the radiologist. I do not see any concerning findings. The formal read will post to your MyChart, hopefully soon. Please recheck with your primary care provider in 2 days.  Keep finger splinted to protect the wound and limit joint movement which may cause the glue to come off too soon.

## 2024-03-29 NOTE — ED Triage Notes (Signed)
 Lac to left index finger. Unsure of tetanus. Bleeding during triage.

## 2024-03-29 NOTE — ED Provider Notes (Signed)
 [place] EMERGENCY DEPARTMENT AT [facility] Provider Note   CSN: [id number] Arrival date & time: 03/29/24  1537     History  Chief Complaint  Patient presents with   Laceration    Johnny Logan is a 68 y.o. male.  68 year old male presents with laceration to left index finger.  Patient was using a grinder when he accidentally cut his finger.  Last tetanus is unknown.  Bleeding controlled with pressure.  Not anticoagulated.       Home Medications Prior to Admission medications   Medication Sig Start Date End Date Taking? Authorizing Provider  aspirin EC 81 MG tablet Take 81 mg by mouth daily.    [provider]  EPINEPHrine  0.3 mg/0.3 mL IJ SOAJ injection epinephrine  0.3 mg/0.3 mL injection, auto-injector    [provider]  ondansetron  (ZOFRAN  ODT) 4 MG disintegrating tablet Take 1 tablet (4 mg total) by mouth every 8 (eight) hours as needed for up to 10 doses for nausea or vomiting. 09/17/20   Hugo Maes, MD  pravastatin (PRAVACHOL) 40 MG tablet Take 40 mg by mouth daily at 6 PM.    [provider]  PRESCRIPTION MEDICATION Place 1 Device into the nose as directed. CPAP Machine used at night    [provider]  telmisartan (MICARDIS) 40 MG tablet Take 40 mg by mouth daily.     [provider]      Allergies    Codeine    Review of Systems   Review of Systems Negative except as per HPI Physical Exam Updated Vital Signs BP (!) 162/76 (BP Location: Right Arm)   Pulse 65   Temp 98.2 F (36.8 C)   Resp 18   SpO2 100%  Physical Exam Vitals and nursing note reviewed.  Constitutional:      General: He is not in acute distress.    Appearance: He is well-developed. He is not diaphoretic.  HENT:     Head: Normocephalic and atraumatic.  Pulmonary:     Effort: Pulmonary effort is normal.  Musculoskeletal:        General: Tenderness and signs of injury present.     Comments: Laceration/abrasion to palmar  aspect of left index finger at the DIP.  Skin:    General: Skin is warm and dry.     Capillary Refill: Capillary refill takes less than 2 seconds.     Findings: No erythema or rash.  Neurological:     Mental Status: He is alert and oriented to person, place, and time.     Sensory: No sensory deficit.     Motor: No weakness.  Psychiatric:        Behavior: Behavior normal.     ED Results / Procedures / Treatments   Labs (all labs ordered are listed, but only abnormal results are displayed) Labs Reviewed - No data to display  EKG None  Radiology DG Finger Index Left Result Date: 03/29/2024 CLINICAL DATA:  Left index finger laceration. EXAM: LEFT INDEX FINGER 2+V COMPARISON:  None Available. FINDINGS: There is no evidence of fracture or dislocation. There is no evidence of arthropathy or other focal bone abnormality. A small superficial soft tissue defect is seen along the lateral aspect of the second left finger, at the level of the middle phalanx. IMPRESSION: Superficial soft tissue defect without evidence of an acute osseous abnormality. Electronically Signed   By: Virgle Grime M.D.   On: 03/29/2024 19:07    Procedures .Laceration Repair  Date/Time: 03/29/2024 6:09 PM  Performed by: Darlis Eisenmenger, PA-C Authorized by: Darlis Eisenmenger, PA-C   Consent:    Consent obtained:  Verbal   Consent given by:  Patient   Risks, benefits, and alternatives were discussed: yes     Risks discussed:  Infection, pain, need for additional repair, poor cosmetic result and poor wound healing   Alternatives discussed:  No treatment Universal protocol:    Patient identity confirmed:  Verbally with patient Anesthesia:    Anesthesia method:  Topical application   Topical anesthesia: soaked in lidocaine  with epi for hemostasis. Laceration details:    Length (cm):  1   Depth (mm):  2 Pre-procedure details:    Preparation:  Patient was prepped and draped in usual sterile fashion and imaging  obtained to evaluate for foreign bodies Exploration:    Limited defect created (wound extended): no     Hemostasis achieved with:  Tourniquet and epinephrine    Imaging obtained: x-ray     Imaging outcome: foreign body not noted     Wound exploration: wound explored through full range of motion and entire depth of wound visualized     Wound extent: no foreign body, no signs of injury and no underlying fracture     Contaminated: no   Treatment:    Area cleansed with:  Saline   Amount of cleaning:  Standard   Irrigation solution:  Sterile saline   Debridement:  Minimal Skin repair:    Repair method:  Tissue adhesive Repair type:    Repair type:  Simple Post-procedure details:    Dressing:  Splint for protection   Procedure completion:  Tolerated well, no immediate complications     Medications Ordered in ED Medications  Tdap (BOOSTRIX ) injection 0.5 mL (0.5 mLs Intramuscular Given 03/29/24 1553)  lidocaine -EPINEPHrine  (XYLOCAINE  W/EPI) 2 %-1:200000 (PF) injection 10 mL (10 mLs Other Given by Other 03/29/24 1553)    ED Course/ Medical Decision Making/ A&P                                 Medical Decision Making Amount and/or Complexity of Data Reviewed Radiology: ordered.  Risk Prescription drug management.   68 year old male presents with laceration to the left index finger which occurred today while using a grinder.  On exam, he has a defect to the palmar aspect of the left index finger at the DIP where it appears he has a volar wrist or otherwise removed the skin medially at the joint line.  Central area of bleeding, controlled with pressure.  On recheck, bleeding has since stopped.  X-ray as ordered by self does not show acute bony abnormality or foreign body, agree with radiologist interpretation.  Area was anesthetized by soaking and lidocaine  with epi, tourniquet applied.  Wound was minimally debrided and closed with Dermabond.  Splint placed, discussed with patient wound  care to include splint to avoid flexion of the joint which would remove the glue and ultimately potentially cause further bleeding.  Recheck with PCP, return as needed.  Tetanus updated today.        Final Clinical Impression(s) / ED Diagnoses Final diagnoses:  Laceration of left index finger without foreign body without damage to nail, initial encounter    Rx / DC Orders ED Discharge Orders     None         Darlis Eisenmenger, PA-C 03/29/24 2019    Logan, Johnny Gottron  J, MD 03/30/24 [id number]

## 2024-04-03 DIAGNOSIS — L813 Cafe au lait spots: Secondary | ICD-10-CM | POA: Diagnosis not present

## 2024-04-03 DIAGNOSIS — L578 Other skin changes due to chronic exposure to nonionizing radiation: Secondary | ICD-10-CM | POA: Diagnosis not present

## 2024-04-03 DIAGNOSIS — L570 Actinic keratosis: Secondary | ICD-10-CM | POA: Diagnosis not present

## 2024-04-03 DIAGNOSIS — D225 Melanocytic nevi of trunk: Secondary | ICD-10-CM | POA: Diagnosis not present

## 2024-04-03 DIAGNOSIS — Z85828 Personal history of other malignant neoplasm of skin: Secondary | ICD-10-CM | POA: Diagnosis not present

## 2024-04-03 DIAGNOSIS — L821 Other seborrheic keratosis: Secondary | ICD-10-CM | POA: Diagnosis not present

## 2024-04-05 DIAGNOSIS — R251 Tremor, unspecified: Secondary | ICD-10-CM | POA: Diagnosis not present

## 2024-04-05 DIAGNOSIS — L309 Dermatitis, unspecified: Secondary | ICD-10-CM | POA: Diagnosis not present

## 2024-04-05 DIAGNOSIS — S61219A Laceration without foreign body of unspecified finger without damage to nail, initial encounter: Secondary | ICD-10-CM | POA: Diagnosis not present

## 2024-04-30 DIAGNOSIS — H6123 Impacted cerumen, bilateral: Secondary | ICD-10-CM | POA: Diagnosis not present

## 2024-08-06 ENCOUNTER — Telehealth: Payer: Self-pay | Admitting: Neurology

## 2024-08-06 ENCOUNTER — Encounter: Payer: Self-pay | Admitting: Neurology

## 2024-08-06 ENCOUNTER — Ambulatory Visit: Admitting: Neurology

## 2024-08-06 VITALS — BP 132/88 | HR 53 | Ht 70.0 in | Wt 217.0 lb

## 2024-08-06 DIAGNOSIS — M542 Cervicalgia: Secondary | ICD-10-CM

## 2024-08-06 DIAGNOSIS — R251 Tremor, unspecified: Secondary | ICD-10-CM

## 2024-08-06 NOTE — Patient Instructions (Addendum)
 Please make an appointment with your primary care provider to discuss your sudden onset of neck pain last week.  This can have many reasons, I do feel you need to get checked out by your PCP for this.  If you have any sudden onset of one-sided weakness or numbness or tingling or droopy face or slurring of speech, severe headache or neck pain, please call 911 or have someone take you to the emergency room immediately.  You have a rather mild tremor of both hands, likely essential tremor.  But can also be a combination of hereditary tremor, suboptimal hydration, caffeine intake, not always sleeping well. I do not see any signs or symptoms of parkinson's like disease or what we call parkinsonism.  For your tremor, I would not recommend any new medications at this time.   Please remember, that any kind of tremor may be exacerbated by anxiety, anger, nervousness, excitement, dehydration, sleep deprivation, thyroid dysfunction, by caffeine, and low blood sugar values or blood sugar fluctuations. Some medications can exacerbate tremors, this includes certain asthma or COPD medications and certain antidepressants.   We will do a brain scan, called MRI and call you with the test results. We will have to schedule you for this on a separate date. This test requires authorization from your insurance, and we will take care of the insurance process.  So long as your MRI shows benign and age-appropriate findings, we can follow-up in this clinic on an as needed basis.

## 2024-08-06 NOTE — Telephone Encounter (Signed)
 no auth required sent to GI [phone]

## 2024-08-06 NOTE — Progress Notes (Signed)
 Subjective:    Patient ID: Johnny Logan is a 68 y.o. male.  HPI    True Mar, MD, PhD [facility] [address], [address] [address] [place], [place] [address]  Dear Worth,  I saw your patient, Johnny Logan, upon your kind request in my neurologic clinic today for evaluation of his hand tremor.  The patient is unaccompanied today.  As you know, Johnny Logan is a 68 year old male with an underlying medical history of arthritis with status post right total knee replacement, bradycardia, hypertension, hyperlipidemia, OSA on PAP therapy, prostate cancer, squamous cell cancer, dermatitis, fatty liver, anemia, and mild obesity, who reports intermittent hand tremors right more than left for the past year.  He is not particularly bothered by the tremor, it is noticeable when he holds something or holds the hand in a certain angle or writes.  He reports that his maternal grandmother had a hand tremor and his mom in her 31s showed a hand tremor.  He has not fallen.  Last week he went on a golfing trip and overnight had a sudden onset of neck pain which alarmed him but not enough to cancel his golf trip or go to the emergency room or call 911.  He feels at baseline now.  Symptoms lasted for a few minutes.  He has not discussed these with his PCP yet.SABRA  He reports that he went to the bathroom and the pain had gone away.  He denied any one-sided weakness or numbness or tingling at the time but had a mild headache.  He does not typically get headaches.  He is consistent with his AutoPap or CPAP machine.  He sleeps overall quite well.  He admits that he does not always hydrate well and is prone to dehydration, he has increased his water intake lately and overall feels that the tremor may be a little better.  He drinks alcohol occasionally.  He did have some alcohol during the golf trip.  He drinks caffeine daily in the form of coffee, 2 to 3 cups/day and generally 1 cup of tea per  day and soda maybe twice a week.   I reviewed your office note from 04/05/2024.  He had blood work through your office on 03/23/2024 and I reviewed the results in his paper chart: Lipid panel showed benign findings with total cholesterol 148, triglycerides 197, LDL 82.  CMP shows benign findings, BUN was 15, creatinine 1.0, AST 19, ALT 19.  A recent TSH was not included.  He had a brain MRI with and without contrast on 08/31/2021 with indication of asymmetric left-sided hearing loss for several months.  I reviewed the results:  IMPRESSION: Unremarkable appearance of the brain and internal auditory canals.     In addition, I have personally and independently reviewed images through the PACS system.  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    right    His Past Surgical History Is Significant For: Past Surgical History:  Procedure Laterality Date   COLONOSCOPY     KNEE ARTHROSCOPY     bilateral    KNEE ARTHROSCOPY WITH MENISCAL REPAIR Right 07/03/2014   Procedure: RIGHT KNEE ARTHROSCOPY WITH MEDIAL MENISCAL  DEBRIDEMENT, CHONDROPLASTY;  Surgeon: Dempsey Melodi GAILS, MD;  Location: [facility];  Service: Orthopedics;  Laterality: Right;   PROSTATE SURGERY     right ankle reconstructive surgery       His Family History Is Significant For: Family History  Problem Relation Age of Onset   Sleep apnea Mother    Tremor Neg Hx     His Social History Is Significant For: Social History   Socioeconomic History   Marital status: Married    Spouse name: Not on file   Number of children: Not on file   Years of education:  Not on file   Highest education level: Not on file  Occupational History   Not on file  Tobacco Use   Smoking status: Never    Passive exposure: Never   Smokeless tobacco: Never  Vaping Use   Vaping status: Never Used  Substance and Sexual Activity   Alcohol use: Not Currently    Alcohol/week: 1.0 standard drink of alcohol    Types: 1 Cans of beer per week    Comment: occasional    Drug use: Never   Sexual activity: Not on file  Other Topics Concern   Not on file  Social History Narrative   2- 3 cups of coffee a day , 1-2 cups a day    Social Drivers of Corporate investment banker Strain: Not on file  Food Insecurity: Low Risk  (07/26/2024)   Received from [facility]   Hunger Vital Sign    Within the past 12 months, you worried that your food would run out before you got money to buy more: Never true    Within the past 12 months, the food you bought just didn't last and you didn't have money to get more. : Never true  Transportation Needs: No Transportation Needs (07/26/2024)   Received from [facility]    In the past 12 months, has lack of reliable transportation kept you from medical appointments, meetings, work or from getting things needed for daily living? : No  Physical Activity: Not on file  Stress: Not on file  Social Connections: Not on file    His Allergies Are:  Allergies  Allergen Reactions   Yellow Jacket Venom Anaphylaxis   Codeine Rash    Tolerates other opiates.  :   His Current Medications Are:  Outpatient Encounter Medications as of 08/06/2024  Medication Sig   EPINEPHrine  0.3 mg/0.3 mL IJ SOAJ injection epinephrine  0.3 mg/0.3 mL injection, auto-injector   pravastatin (PRAVACHOL) 40 MG tablet Take 40 mg by mouth daily at 6 PM.   PRESCRIPTION MEDICATION Place 1 Device into the nose as directed. CPAP Machine used at night   telmisartan (MICARDIS) 40 MG tablet Take 40 mg by mouth daily.    aspirin EC 81 MG tablet Take 81 mg by  mouth daily. (Patient not taking: Reported on 08/06/2024)   ondansetron  (ZOFRAN  ODT) 4 MG disintegrating tablet Take 1 tablet (4 mg total) by mouth every 8 (eight) hours as needed for up to 10 doses for nausea or vomiting. (Patient not taking: Reported on 08/06/2024)   No facility-administered encounter medications on file as of 08/06/2024.  :   Review of Systems:  Out of a complete 14 point  review of systems, all are reviewed and negative with the exception of these symptoms as listed below:  Review of Systems  Neurological:        Patient is here alone for right hand tremor - would went offline when writing and some shaking. Brought CPAP machine     Objective:  Neurological Exam  Physical Exam Physical Examination:   Vitals:   08/06/24 0817  BP: 132/88  Pulse: (!) 53  SpO2: 98%    General Examination: The patient is a very pleasant 68 y.o. male in no acute distress. He appears well-developed and well-nourished and well groomed.   HEENT: Normocephalic, atraumatic, pupils are equal, round and reactive to light, extraocular tracking is good without limitation to gaze excursion or nystagmus noted. No photophobia. Corrective eye glasses in place. Hearing is grossly intact.  Face is symmetric with normal facial animation and normal facial sensation to light touch, temperature and vibration sense. There is no lip, neck/head, jaw or voice tremor. Neck is supple with full range of passive and active motion. There are no carotid bruits on auscultation.  Airway/Oropharynx exam reveals: Mild to moderate mouth dryness, adequate dental hygiene, moderate airway crowding.  Tongue protrudes centrally and palate elevates symmetrically.   Chest: Clear to auscultation without wheezing, rhonchi or crackles noted.  Heart: S1+S2+0, regular and normal without murmurs, rubs or gallops noted.   Abdomen: Soft, non-tender and non-distended.  Extremities: There is no pitting edema in the distal lower  extremities bilaterally.   Skin: Warm and dry without trophic changes noted.   Musculoskeletal: exam reveals no obvious joint deformities.  Unremarkable knee replacement scar on the right.  Neurologically:  Mental status: The patient is awake, alert and oriented in all 4 spheres. His immediate and remote memory, attention, language skills and fund of knowledge are appropriate. There is no evidence of aphasia, agnosia, apraxia or anomia. Speech is clear with normal prosody and enunciation. Thought process is linear. Mood is normal and affect is normal.  Cranial nerves II - XII are as described above under HEENT exam.  Motor exam: Normal bulk, strength and tone is noted. There is no obvious action or resting tremor.  No drift or rebound, no significant postural tremor except minimal bilaterally. On Archimedes spiral drawing he has minimal insecurity and trembling with the left hand, minimal to mild trembling with the right hand.  Handwriting with the right hand is legible, very slightly tremulous, not particularly micrographic, able to write cursive. Fine motor skills and coordination: Intact finger taps, hand movements and rapid alternating patterning with both upper extremities, normal foot taps bilaterally in the lower extremities, no decrement in amplitude.  Cerebellar testing: No dysmetria or intention tremor. There is no truncal or gait ataxia.  Normal finger-to-nose, normal heel-to-shin bilaterally. Sensory exam: intact to light touch in the upper and lower extremities.  Reflexes are 1+ in the upper extremities, absent in the knees bilaterally, trace in both ankles.  Toes are downgoing bilaterally. Gait, station and balance: He stands easily. No veering to one side is noted. No leaning to one side is noted. Posture is age-appropriate and stance is narrow based. Gait shows normal stride length and normal pace. No problems turning are noted.  Tandem walk slightly challenging for him but doable.   Romberg negative.  Assessment and Plan:  In summary, TYRIQ MORAGNE is a 68 year old male with an underlying medical history of arthritis with status post right total knee replacement, bradycardia, hypertension, hyperlipidemia, OSA on PAP therapy,  prostate cancer, squamous cell cancer, dermatitis, fatty liver, anemia, and mild obesity, who presents for evaluation of his hand tremor of approximately 1 years duration with right sided symptoms more than left.  History and examination are normal in keeping with parkinsonism.  He is largely reassured today.  He feels that the tremor has fluctuated.  Today is a good day.  He admits that he has been trying to hydrate better with water which may have helped.  He may have a mild form of essential tremor given his family history on his mom side, contributors to tremors, etiology and alleviating factors were discussed with him.  He is advised to follow-up with you as soon as possible regarding his neck pain episode.  He is advised that this could have many reasons and since he was quite alarmed in the moment, I he is advised to get checked out.  He is furthermore advised to proceed to the emergency room immediately if he were to have any acute symptoms of any kind and not to delay evaluation.  We will proceed with a brain MRI to rule out a structural cause of his tremor.  So long as the brain MRI is benign we can follow-up in this clinic as needed.  I did not recommend any symptomatic medication for his tremor which was rather mild on exam today.  I answered all his questions today and he was in agreement with our plan.  We will keep him posted as to his MRI results via phone and/or MyChart message. Thank you very much for allowing me to participate in the care of this nice patient. If I can be of any further assistance to you please do not hesitate to call me at [phone].  Sincerely,   True Mar, MD, PhD

## 2024-08-09 DIAGNOSIS — R079 Chest pain, unspecified: Secondary | ICD-10-CM | POA: Diagnosis not present

## 2024-08-13 DIAGNOSIS — C44321 Squamous cell carcinoma of skin of nose: Secondary | ICD-10-CM | POA: Diagnosis not present

## 2024-08-13 DIAGNOSIS — D485 Neoplasm of uncertain behavior of skin: Secondary | ICD-10-CM | POA: Diagnosis not present

## 2024-08-13 DIAGNOSIS — D0461 Carcinoma in situ of skin of right upper limb, including shoulder: Secondary | ICD-10-CM | POA: Diagnosis not present

## 2024-08-21 ENCOUNTER — Ambulatory Visit: Payer: Self-pay | Admitting: Neurology

## 2024-08-21 ENCOUNTER — Ambulatory Visit
Admission: RE | Admit: 2024-08-21 | Discharge: 2024-08-21 | Disposition: A | Source: Ambulatory Visit | Attending: Neurology | Admitting: Neurology

## 2024-08-21 DIAGNOSIS — R251 Tremor, unspecified: Secondary | ICD-10-CM

## 2024-08-21 DIAGNOSIS — M542 Cervicalgia: Secondary | ICD-10-CM

## 2024-08-21 MED ORDER — GADOPICLENOL 0.5 MMOL/ML IV SOLN
10.0000 mL | Freq: Once | INTRAVENOUS | Status: AC | PRN
Start: 2024-08-21 — End: 2024-08-21
  Administered 2024-08-21: 10 mL via INTRAVENOUS

## 2024-08-27 DIAGNOSIS — C44321 Squamous cell carcinoma of skin of nose: Secondary | ICD-10-CM | POA: Diagnosis not present

## 2024-08-27 DIAGNOSIS — C44622 Squamous cell carcinoma of skin of right upper limb, including shoulder: Secondary | ICD-10-CM | POA: Diagnosis not present
